# Patient Record
Sex: Female | Born: 2000 | Race: Black or African American | Hispanic: No | Marital: Single | Smoking: Never smoker
Health system: Southern US, Community
[De-identification: ages and names within clinical notes are randomized; demographics above are authoritative.]

## PROBLEM LIST (undated history)

## (undated) DIAGNOSIS — A749 Chlamydial infection, unspecified: Secondary | ICD-10-CM

## (undated) DIAGNOSIS — I10 Essential (primary) hypertension: Secondary | ICD-10-CM

## (undated) DIAGNOSIS — O24419 Gestational diabetes mellitus in pregnancy, unspecified control: Secondary | ICD-10-CM

## (undated) DIAGNOSIS — J45909 Unspecified asthma, uncomplicated: Secondary | ICD-10-CM

## (undated) DIAGNOSIS — A549 Gonococcal infection, unspecified: Secondary | ICD-10-CM

## (undated) HISTORY — DX: Gonococcal infection, unspecified: A54.9

## (undated) HISTORY — DX: Gestational diabetes mellitus in pregnancy, unspecified control: O24.419

## (undated) HISTORY — DX: Unspecified asthma, uncomplicated: J45.909

## (undated) HISTORY — DX: Chlamydial infection, unspecified: A74.9

## (undated) HISTORY — PX: NO PAST SURGERIES: SHX2092

---
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
## 2021-01-06 ENCOUNTER — Telehealth: Payer: Self-pay | Admitting: Registered"

## 2021-01-06 ENCOUNTER — Other Ambulatory Visit: Payer: Self-pay | Admitting: Family Medicine

## 2021-01-06 ENCOUNTER — Other Ambulatory Visit: Payer: Medicaid Other

## 2021-01-06 DIAGNOSIS — Z3689 Encounter for other specified antenatal screening: Secondary | ICD-10-CM

## 2021-01-06 NOTE — Telephone Encounter (Signed)
LVM for patient after she was late for Diabetes education visit. Asked her to send MyChart msg or call to reschedule

## 2021-01-11 ENCOUNTER — Encounter: Payer: Self-pay | Admitting: *Deleted

## 2021-01-11 DIAGNOSIS — O24419 Gestational diabetes mellitus in pregnancy, unspecified control: Secondary | ICD-10-CM | POA: Insufficient documentation

## 2021-01-11 DIAGNOSIS — O0990 Supervision of high risk pregnancy, unspecified, unspecified trimester: Secondary | ICD-10-CM | POA: Insufficient documentation

## 2021-01-20 ENCOUNTER — Other Ambulatory Visit: Payer: Self-pay

## 2021-01-20 ENCOUNTER — Encounter: Payer: Self-pay | Admitting: Family Medicine

## 2021-01-20 ENCOUNTER — Encounter: Payer: Self-pay | Admitting: Obstetrics and Gynecology

## 2021-01-20 ENCOUNTER — Encounter: Payer: Medicaid Other | Admitting: Obstetrics and Gynecology

## 2021-01-20 ENCOUNTER — Ambulatory Visit (INDEPENDENT_AMBULATORY_CARE_PROVIDER_SITE_OTHER): Payer: Medicaid Other | Admitting: Obstetrics and Gynecology

## 2021-01-20 VITALS — BP 133/74 | HR 78 | Wt 197.6 lb

## 2021-01-20 DIAGNOSIS — O0990 Supervision of high risk pregnancy, unspecified, unspecified trimester: Secondary | ICD-10-CM

## 2021-01-20 DIAGNOSIS — O24419 Gestational diabetes mellitus in pregnancy, unspecified control: Secondary | ICD-10-CM

## 2021-01-20 DIAGNOSIS — Z3A27 27 weeks gestation of pregnancy: Secondary | ICD-10-CM

## 2021-01-20 NOTE — Progress Notes (Signed)
° °  PRENATAL VISIT NOTE  Subjective:  Sabrina Dixon is a 20 y.o. G1P0 at [redacted]w[redacted]d being seen today for ongoing prenatal care.  She is currently monitored for the following issues for this high-risk pregnancy and has Supervision of high risk pregnancy, antepartum; Gestational diabetes mellitus (GDM) affecting pregnancy; and [redacted] weeks gestation of pregnancy on their problem list.  Pt is a transfer of care from [facility] due to gestational diabetes.  Pt has not had diabetic training as of yet.  Patient doing well with no acute concerns today. She reports  occasional pain with movement .   . Vag. Bleeding: None.  Movement: Present. Denies leaking of fluid.   The following portions of the patient's history were reviewed and updated as appropriate: allergies, current medications, past family history, past medical history, past social history, past surgical history and problem list. Problem list updated.  Objective:   Vitals:   01/20/21 1008  BP: 133/74  Pulse: 78  Weight: 197 lb 9.6 oz (89.6 kg)    Fetal Status: Fetal Heart Rate (bpm): 145 Fundal Height: 28 cm Movement: Present     General:  Alert, oriented and cooperative. Patient is in no acute distress.  Skin: Skin is warm and dry. No rash noted.   Cardiovascular: Normal heart rate noted  Respiratory: Normal respiratory effort, no problems with respiration noted  Abdomen: Soft, gravid, appropriate for gestational age.  Pain/Pressure: Absent     Pelvic: Cervical exam deferred        Extremities: Normal range of motion.  Edema: None  Mental Status:  Normal mood and affect. Normal behavior. Normal judgment and thought content.   Assessment and Plan:  Pregnancy: G1P0 at [redacted]w[redacted]d  1. Supervision of high risk pregnancy, antepartum Continue routine care  2. Gestational diabetes mellitus (GDM) affecting pregnancy Glucose monitoring sheets given, expectations given once she has had training  3. [redacted] weeks gestation of pregnancy   Preterm labor  symptoms and general obstetric precautions including but not limited to vaginal bleeding, contractions, leaking of fluid and fetal movement were reviewed in detail with the patient.  Please refer to After Visit Summary for other counseling recommendations.   Return in about 2 weeks (around 02/03/2021) for [facility], in person.   Mariel Aloe, MD Faculty Attending Center for [facility]

## 2021-01-20 NOTE — Progress Notes (Signed)
Patient (27w.4d) is here to establish prenatal care. She is a transfer from [facility] due to her having GDM. Appointment for diabetes education was made for 01/06/21 but pt was a not show. Appointment will be rescheduled.   Patient is aware of upcoming ultrasound appointment on 02/03/21 and did not have any questions or concerns for today.   Per [place], CMA

## 2021-01-27 ENCOUNTER — Encounter: Payer: Medicaid Other | Attending: Family Medicine | Admitting: Registered"

## 2021-01-27 ENCOUNTER — Other Ambulatory Visit: Payer: Self-pay | Admitting: *Deleted

## 2021-01-27 ENCOUNTER — Other Ambulatory Visit: Payer: Self-pay

## 2021-01-27 ENCOUNTER — Ambulatory Visit: Payer: Medicaid Other | Admitting: Registered"

## 2021-01-27 ENCOUNTER — Telehealth: Payer: Self-pay | Admitting: Lactation Services

## 2021-01-27 VITALS — Wt 172.3 lb

## 2021-01-27 DIAGNOSIS — O24419 Gestational diabetes mellitus in pregnancy, unspecified control: Secondary | ICD-10-CM

## 2021-01-27 DIAGNOSIS — Z3A00 Weeks of gestation of pregnancy not specified: Secondary | ICD-10-CM | POA: Diagnosis not present

## 2021-01-27 MED ORDER — ACCU-CHEK SOFTCLIX LANCETS MISC: 12 refills | Status: DC

## 2021-01-27 MED ORDER — ACCU-CHEK GUIDE VI STRP: ORAL_STRIP | 12 refills | Status: DC

## 2021-01-27 NOTE — Progress Notes (Signed)
Patient was seen for Gestational Diabetes self-management on 01/27/21  Start time 1018 and End time 1112   Estimated due date: 04/17/21; [redacted]w[redacted]d  Wt Readings from Last 3 Encounters:  01/27/21 172 lb 4.8 oz (78.2 kg) (92 %, Z= 1.44)*  01/20/21 197 lb 9.6 oz (89.6 kg) (97 %, Z= 1.91)*   * Growth percentiles are based on CDC (Girls, 2-20 Years) data.   The weight entered on 01/20/21 is likely incorrect. The discrepancy was not noticed until after the patient left so weight was not re-taken. Just from visual guess of weight, 172 is more likely to be correct.  Clinical: Medications: reviewed Medical History: reviewed  Labs: OGTT 1 hr 815, A1c 5.6%   Dietary and Lifestyle History: Pt states her mother had GDM with her brother 10 yr ago.   Pt reports N/V first 7 weeks of pregnancy, followed by 2 weeks of intermittent hypoglycemic sxs. Pt denies hypoglycemic sxs since then.   Pt state she likes to eat Ramen noodles and family has told her not to eat it. Pt reports she likes a variety of vegetables and does not drink soda.  Physical Activity: 7x/week 30 min (walking dogs several times per day) Stress: not assessed Sleep: not assesed  24 hr Recall: not assessed First Meal: Snack: Second meal: Snack: Third meal: Snack: Beverages: water, coffee ~1x/week w/ 1 Tbs sugar  NUTRITION INTERVENTION  Nutrition education (E-1) on the following topics:   Initial Follow-up  [x]  []  Definition of Gestational Diabetes [x]  []  Why dietary management is important in controlling blood glucose [x]  []  Effects each nutrient has on blood glucose levels [x]  []  Simple carbohydrates vs complex carbohydrates []  []  Fluid intake [x]  []  Creating a balanced meal plan [x]  []  Carbohydrate counting  [x]  []  When to check blood glucose levels [x]  []  Proper blood glucose monitoring techniques [x]  []  Effect of stress and stress reduction techniques  [x]  []  Exercise effect on blood glucose levels, appropriate  exercise during pregnancy [x]  []  Importance of limiting caffeine and abstaining from alcohol and smoking [x]  []  Medications used for blood sugar control during pregnancy [x]  []  Hypoglycemia and rule of 15 [x]  []  Postpartum self care  Blood glucose monitor given: Accu-chek Guide Me Lot Exp: 03/11/2022 CBG: 113 mg/dL  Patient instructed to monitor glucose levels: FBS: 60 - ? 95 mg/dL (some clinics use 90 for cutoff) 1 hour: ? 140 mg/dL 2 hour: ? mg/dL  Patient received handouts: Nutrition Diabetes and Pregnancy Carbohydrate Counting List  Patient will be seen for follow-up as needed.

## 2021-01-27 NOTE — Telephone Encounter (Signed)
Patient with questions about leaking breast milk since 17 weeks while in the office seeing Diabetes Educator.   Reviewed leaking of breast milk normal during pregnancy. Patient asked if she should pump, advised no pumping during pregnancy. Reviewed some women do hand express after 37 weeks of pregnancy but no pumping recommended.   Patient voiced understanding with no further questions or concerns.

## 2021-01-30 DIAGNOSIS — Z8632 Personal history of gestational diabetes: Secondary | ICD-10-CM

## 2021-01-30 HISTORY — DX: Personal history of gestational diabetes: Z86.32

## 2021-02-03 ENCOUNTER — Ambulatory Visit: Payer: Medicaid Other | Admitting: *Deleted

## 2021-02-03 ENCOUNTER — Ambulatory Visit: Payer: Medicaid Other | Attending: Family Medicine

## 2021-02-03 ENCOUNTER — Other Ambulatory Visit: Payer: Self-pay

## 2021-02-03 ENCOUNTER — Encounter: Payer: Self-pay | Admitting: *Deleted

## 2021-02-03 ENCOUNTER — Ambulatory Visit (HOSPITAL_BASED_OUTPATIENT_CLINIC_OR_DEPARTMENT_OTHER): Payer: Medicaid Other | Admitting: Obstetrics and Gynecology

## 2021-02-03 VITALS — BP 139/71 | HR 91

## 2021-02-03 DIAGNOSIS — O0990 Supervision of high risk pregnancy, unspecified, unspecified trimester: Secondary | ICD-10-CM | POA: Diagnosis present

## 2021-02-03 DIAGNOSIS — O24410 Gestational diabetes mellitus in pregnancy, diet controlled: Secondary | ICD-10-CM | POA: Diagnosis not present

## 2021-02-03 DIAGNOSIS — Z3689 Encounter for other specified antenatal screening: Secondary | ICD-10-CM

## 2021-02-03 DIAGNOSIS — O24419 Gestational diabetes mellitus in pregnancy, unspecified control: Secondary | ICD-10-CM | POA: Insufficient documentation

## 2021-02-03 DIAGNOSIS — Z3A29 29 weeks gestation of pregnancy: Secondary | ICD-10-CM | POA: Diagnosis not present

## 2021-02-03 NOTE — Progress Notes (Signed)
Maternal-Fetal Medicine   Name: Ruqayyah Corbit DOB: 07/08/2000 MRN: [id number] Referring Provider: Lynnda Shields, MD  I had the pleasure of seeing Ms. Jungman today at the [facility]. She is G1 P0 at 29w 4d gestation and is here for ultrasound evaluation of her pregnancy.  She has a diagnosis of gestational diabetes and has transferred her prenatal care [facility] the [facility].  Patient had consultation with the diabetic educator and is started checking her blood glucose regularly.  She reports both fasting and postprandial levels are within normal range. She informed that she had prenatal screening for fetal aneuploidies and the risk for not increased.  Ultrasound Fetal growth is appropriate for gestational age.  Amniotic fluid is normal good fetal activity seen.  Fetal anatomical survey appears normal but limited by advanced gestational age.  Gestational diabetes I explained the diagnosis of gestational diabetes.  I emphasized the importance of good blood glucose control to prevent adverse fetal or neonatal outcomes.  I discussed blood glucose normal values. I encouraged her to check her blood glucose regularly. Possible complications of gestational diabetes include fetal macrosomia, shoulder dystocia and birth injuries, stillbirth (in poorly controlled diabetes) and neonatal respiratory syndrome and other complications.  In about 85% of cases, gestational diabetes is well controlled by diet alone.  Exercise reduces the need for insulin.  Medical treatment includes oral hypoglycemics or insulin. If patient requires insulin or oral hypoglycemics, we recommend weekly antenatal testing from 32 weeks' gestation till delivery.  Timing of delivery: In well-controlled diabetes on diet, patient can be delivered at 14- or 40-weeks' gestation. Vaginal delivery is not contraindicated. Type 2 diabetes develops in about 25% to 40% of women with GDM. I recommend postpartum  screening with 75-g glucose load at 6 to 12 weeks after delivery.  Recommendations -An appointment was made for her to return in 4 weeks for fetal growth assessment. -If patient requires oral hypoglycemics or insulin, we recommend weekly BPP from [redacted] weeks gestation till delivery.  Thank you for consultation.  If you have any questions or concerns, please contact me the [facility].  Consultation including face-to-face (more than 50%) counseling 30 minutes.

## 2021-02-04 ENCOUNTER — Other Ambulatory Visit: Payer: Self-pay | Admitting: *Deleted

## 2021-02-04 DIAGNOSIS — O26849 Uterine size-date discrepancy, unspecified trimester: Secondary | ICD-10-CM

## 2021-02-04 DIAGNOSIS — O24410 Gestational diabetes mellitus in pregnancy, diet controlled: Secondary | ICD-10-CM

## 2021-02-04 DIAGNOSIS — Z3689 Encounter for other specified antenatal screening: Secondary | ICD-10-CM

## 2021-02-08 ENCOUNTER — Ambulatory Visit (INDEPENDENT_AMBULATORY_CARE_PROVIDER_SITE_OTHER): Payer: Medicaid Other | Admitting: Family Medicine

## 2021-02-08 ENCOUNTER — Other Ambulatory Visit: Payer: Self-pay

## 2021-02-08 ENCOUNTER — Other Ambulatory Visit (HOSPITAL_COMMUNITY)
Admission: RE | Admit: 2021-02-08 | Discharge: 2021-02-08 | Disposition: A | Discharge status: Home / Self Care | Payer: Medicaid Other | Source: Ambulatory Visit | Attending: Family Medicine | Admitting: Family Medicine

## 2021-02-08 VITALS — BP 130/72 | HR 102 | Wt 175.9 lb

## 2021-02-08 DIAGNOSIS — O24419 Gestational diabetes mellitus in pregnancy, unspecified control: Secondary | ICD-10-CM

## 2021-02-08 DIAGNOSIS — O0990 Supervision of high risk pregnancy, unspecified, unspecified trimester: Secondary | ICD-10-CM

## 2021-02-08 DIAGNOSIS — A749 Chlamydial infection, unspecified: Secondary | ICD-10-CM | POA: Insufficient documentation

## 2021-02-08 NOTE — Patient Instructions (Addendum)
Contraception Choices °Contraception, also called birth control, refers to methods or devices that prevent pregnancy. °Hormonal methods °Contraceptive implant °A contraceptive implant is a thin, plastic tube that contains a hormone that prevents pregnancy. It is different from an intrauterine device (IUD). It is inserted into the upper part of the arm by a health care provider. Implants can be effective for up to 3 years. °Progestin-only injections °Progestin-only injections are injections of progestin, a synthetic form of the hormone progesterone. They are given every 3 months by a health care provider. °Birth control pills °Birth control pills are pills that contain hormones that prevent pregnancy. They must be taken once a day, preferably at the same time each day. A prescription is needed to use this method of contraception. °Birth control patch °The birth control patch contains hormones that prevent pregnancy. It is placed on the skin and must be changed once a week for three weeks and removed on the fourth week. A prescription is needed to use this method of contraception. °Vaginal ring °A vaginal ring contains hormones that prevent pregnancy. It is placed in the vagina for three weeks and removed on the fourth week. After that, the process is repeated with a new ring. A prescription is needed to use this method of contraception. °Emergency contraceptive °Emergency contraceptives prevent pregnancy after unprotected sex. They come in pill form and can be taken up to 5 days after sex. They work best the sooner they are taken after having sex. Most emergency contraceptives are available without a prescription. This method should not be used as your only form of birth control. °Barrier methods °Female condom °A female condom is a thin sheath that is worn over the penis during sex. Condoms keep sperm from going inside a woman's body. They can be used with a sperm-killing substance (spermicide) to increase their  effectiveness. They should be thrown away after one use. °Female condom °A female condom is a soft, loose-fitting sheath that is put into the vagina before sex. The condom keeps sperm from going inside a woman's body. They should be thrown away after one use. °Diaphragm °A diaphragm is a soft, dome-shaped barrier. It is inserted into the vagina before sex, along with a spermicide. The diaphragm blocks sperm from entering the uterus, and the spermicide kills sperm. A diaphragm should be left in the vagina for 6-8 hours after sex and removed within 24 hours. °A diaphragm is prescribed and fitted by a health care provider. A diaphragm should be replaced every 1-2 years, after giving birth, after gaining more than 15 lb (6.8 kg), and after pelvic surgery. °Cervical cap °A cervical cap is a round, soft latex or plastic cup that fits over the cervix. It is inserted into the vagina before sex, along with spermicide. It blocks sperm from entering the uterus. The cap should be left in place for 6-8 hours after sex and removed within 48 hours. A cervical cap must be prescribed and fitted by a health care provider. It should be replaced every 2 years. °Sponge °A sponge is a soft, circular piece of polyurethane foam with spermicide in it. The sponge helps block sperm from entering the uterus, and the spermicide kills sperm. To use it, you make it wet and then insert it into the vagina. It should be inserted before sex, left in for at least 6 hours after sex, and removed and thrown away within 30 hours. °Spermicides °Spermicides are chemicals that kill or block sperm from entering the cervix and uterus.   They can come as a cream, jelly, suppository, foam, or tablet. A spermicide should be inserted into the vagina with an applicator at least 10-15 minutes before sex to allow time for it to work. The process must be repeated every time you have sex. Spermicides do not require a prescription. Intrauterine  contraception Intrauterine device (IUD) An IUD is a T-shaped device that is put in a woman's uterus. There are two types: Hormone IUD.This type contains progestin, a synthetic form of the hormone progesterone. This type can stay in place for 3-5 years. Copper IUD.This type is wrapped in copper wire. It can stay in place for 10 years. Permanent methods of contraception Female tubal ligation In this method, a woman's fallopian tubes are sealed, tied, or blocked during surgery to prevent eggs from traveling to the uterus. Hysteroscopic sterilization In this method, a small, flexible insert is placed into each fallopian tube. The inserts cause scar tissue to form in the fallopian tubes and block them, so sperm cannot reach an egg. The procedure takes about 3 months to be effective. Another form of birth control must be used during those 3 months. Female sterilization This is a procedure to tie off the tubes that carry sperm (vasectomy). After the procedure, the man can still ejaculate fluid (semen). Another form of birth control must be used for 3 months after the procedure. Natural planning methods Natural family planning In this method, a couple does not have sex on days when the woman could become pregnant. Calendar method In this method, the woman keeps track of the length of each menstrual cycle, identifies the days when pregnancy can happen, and does not have sex on those days. Ovulation method In this method, a couple avoids sex during ovulation. Symptothermal method This method involves not having sex during ovulation. The woman typically checks for ovulation by watching changes in her temperature and in the consistency of cervical mucus. Post-ovulation method In this method, a couple waits to have sex until after ovulation. Where to find more information Centers for Disease Control and Prevention: FootballExhibition.com.br Summary Contraception, also called birth control, refers to methods or devices  that prevent pregnancy. Hormonal methods of contraception include implants, injections, pills, patches, vaginal rings, and emergency contraceptives. Barrier methods of contraception can include female condoms, female condoms, diaphragms, cervical caps, sponges, and spermicides. There are two types of IUDs (intrauterine devices). An IUD can be put in a woman's uterus to prevent pregnancy for 3-5 years. Permanent sterilization can be done through a procedure for males and females. Natural family planning methods involve nothaving sex on days when the woman could become pregnant. This information is not intended to replace advice given to you by your health care provider. Make sure you discuss any questions you have with your health care provider. Document Revised: 06/23/2019 Document Reviewed: 06/23/2019 Elsevier Patient Education  2022 Elsevier Inc.   AREA PEDIATRIC/FAMILY PRACTICE PHYSICIANS  Central/Southeast [place] ([id number]) [facility] Deirdre Priest, MD; Lum Babe, MD; Sheffield Slider, MD; Leveda Anna, MD; McDiarmid, MD; Jerene Bears, MD; Jennette Kettle, MD; Gwendolyn Grant, MD [address], [place], [place] [address] [phone] Mon-Fri 8:30-12:30, 1:30-5:00 Providers come to see babies at [facility] Accepting [facility] providers who accept newborns: Docia Chuck, MD; Kateri Plummer, MD; Paulino Rily, MD [address], [place], [place] [address] [phone] Mon-Fri 8:00-5:30 Babies seen by providers at [facility] Does NOT accept Medicaid Please call early in hospitalization for appointment (limited availability)  [facility], MD [phone]  [address] [place], [place] [address] [phone] Farris HasMon, Tue, Thur, Fri 8:30-5:00, Wed 10:00-7:00 (closed 1-2pm) Babies seen by [facility] providers Accepting Medicaid Donnie Coffinubin - Pediatrician Donnie Coffinubin, MD [address], [place], [place] [address] [phone] Mon-Fri  8:30-5:00, Sat 8:30-12:00 Provider comes to see babies at [facility] Accepting Medicaid Must have been referred from current patients or contacted office prior to delivery [facility] Manson PasseyBrown, MD; Ave Filterhandler, MD; Luna FuseEttefagh, MD; Kennedy BuckerGrant, MD; Konrad DoloresLester, MD; Kathlene NovemberMcCormick, MD; Jenne CampusMcQueen, MD; Lubertha SouthProse, MD; Wynetta EmerySimha, MD; Duffy RhodyStanley, MD; Gerre CouchStryffeler, NP; Shirl Harrisebben, NP [address], [place], [place] [address] [phone] Mon, Halford Decampue, Thur, Fri 8:30-5:30, Wed 9:30-5:30, Sat 8:30-12:30 Babies seen by [facility] providers Accepting Medicaid Only accepting infants of first-time parents or siblings of current patients Hospital discharge coordinator will make follow-up appointment Cyril MourningJack Amos [address], [place], [place]  [address] [phone]   Fax - [phone] [facility] [address], [place], [place]  [address] Phone - [phone]   Fax - [phone] Lucio EdwardShilpa Gosrani [address], [address], [place], [place]  [address] [phone]  [place] [phone]) [facility] Jenne PaneBates, MD; Alita ChyleBrassfield, MD; Princella Ionooper, Cox, MD; MD; Earlene Plateravis, MD; Jamesetta Orleansovico, MD; Alvera NovelEttefaugh, MD; Clarene DukeLittle, MD; Rana SnareLowe, MD; Carmon GinsbergKeiffer, MD; Alinda MoneyMelvin, MD; Hosie PoissonSumner, MD; Mayford KnifeWilliams, MD [address], [place], [place] [address] [phone] Mon-Fri 8:30-5:00 (extended evenings Mon-Thur as needed), Sat-Sun 10:00-1:00 Providers come to see babies at [facility] Accepting Medicaid for families of first-time babies and families with all children in the household age 263 and under. Must register with office prior to making appointment (M-F only). [facility] Suezanne JacquetHenson, NP; Lynelle DoctorKnapp, MD; Susann GivensLalonde, MD; [place], [place] [address] [place], [place] [address] [phone] Mon-Fri 8:00-5:00 Babies seen by providers at [facility] Does NOT accept Medicaid/Commercial Insurance Only [facility] at [place]  ([facility])  Holly BodilyArtis, MD; Zachery DauerBarnes, MD; Stefan ChurchBratton, MD; Sabino Dickoccaro, MD; Quitman LivingsLockett Gardner, MD; Farris HasKramer, MD; Gaynell FaceMarshall, MD; Betha LoaNetherton, MD; Colon FlatteryPoleto, MD; [place] JamesSkinner, MD [address] [place], [place] [address] [phone] Mon-Fri 8:30-5:30, Sat (Oct.-Mar.) 9:00-1:00 Babies seen by providers at [facility] Accepting [facility]  [place] [phone]) [facility] [place] Reid, MD; Sheliah HatchWarner, MD [address], [place], [place] [phone] Mon-Fri 8:30-5:00, Sat 8:30-12:00 Providers come to see babies at [facility] Does NOT accept [facility] at Lutricia Feilriad Becker, [place]; Tracie HarrierHagler, MD; VerandahScifres, [place]; Wynelle LinkSun, MD; Azucena CecilSwayne, MD [address], [place], [place] [address] [phone] Mon-Fri 8:00-5:00 Babies seen by providers at [facility] Does NOT accept Medicaid Only accepting babies of parents who are patients Please call early in hospitalization for appointment (limited availability) [facility] Chestine Sporelark, MD; Abran CantorFrye, MD; Early OsmondKelleher, MD; Cherre HugerMack, NP; Hyacinth MeekerMiller, MD; Dwan Bolt'Keller, MD; Jarold MottoPatterson, NP; Dario GuardianPudlo, MD; Talmage NapPuzio, MD; Maisie Fushomas, MD; Pricilla Holmucker, MD; Tama Highwiselton, MD [address], [place], [place] [address] [phone] Mon-Fri 8:00-5:00, Sat 9:00-12:00 Providers come to see babies at [facility] Does NOT accept [facility]  [place] [employer] [phone]) [facility] at [facility] providers accepting new patients: Drema PryBrake, NP; Delena ServeWharton, PA [address], [place], [place] [address] [phone] Mon-Fri 8:00-5:00 Babies seen by providers at [facility] Does NOT accept Medicaid Only accepting babies of parents who are patients Please call early in hospitalization for appointment (limited availability) [facility] Cardell PeachGay, MD; Nash DimmerQuinlan, MD [address] [place], [place] [address] [phone] (press 1 to schedule appointment) Mon-Fri 8:00-5:00 Providers come to  see babies at [facility] Does  NOT accept [facility], MD [address] [place], [place] [address] [phone] Mon-Fri 8:30-5:00 (lunch 12:30-1:00), extended hours by appointment only Wed 5:00-6:30 Babies seen by [facility] providers Accepting Medicaid [facility] Carmine, MD; [place], MD; Hassan Rowan, MD [address] [place], [place], [place] [address] [phone] Mon-Fri 8:00-5:00 Babies seen by [facility] providers Does NOT accept Medicaid [facility] Boykin Peek, MD; Durene Cal, MD; [place], [place] [address] [place], [place] [address] [phone] Mon-Fri 8:00-5:00 Babies seen by [facility] providers Does NOT accept [facility] Carrizo, [place]; [place], [place]; Roy, NP; Avis Epley, MD; Vonna Kotyk, MD; Clance Boll, MD; Stevphen Rochester, NP; Arvilla Market, NP; Ann Maki, NP; Otis Dials, NP; Vaughan Basta, MD; Eartha Inch, MD [address] [place], [place] [address] [phone] Mon-Fri 8:30-5:00, Sat 10:00-1:00 Providers come to see babies at [facility] Does NOT accept Medicaid Free prenatal information session Tuesdays at 4:45pm [facility] Sugarmill Woods, MD; [place], [place]; [place], [place]; [place], [place] [address] [place] [address] [phone] Mon-Fri 7:30-5:30 Babies seen by [facility] providers [facility] Doctor [address], [place], [place]  [address] [phone]   Fax - [phone]  [place] [phone] & [phone]) [facility], MD [address], [place], [place] [address] [phone] Mon-Thur 8:00-6:00 Providers come to see babies at [facility] Accepting Medicaid [facility] Dareen Piano, NP; Cyndia Bent, MD; Todd [place], [place]; [place], [place] [address] [place], [place] [address] [phone] Mon-Thur 7:30-7:30, Fri 7:30-4:30 Babies seen by [facility] providers Accepting [facility] Juanito Doom, MD; Janene Harvey, NP;  Vonita Moss, MD [address], [place], [place] [address] [phone] Mon-Fri 8:30-5:00, Sat 8:30-12:00 Providers come to see babies at [facility] Accepting Medicaid Must have Meet & Greet appointment at office prior to delivery [facility] Marlow Baars, MD; Earlene Plater, MD; Lucretia Roers, MD [address], [place], [place] [address] [phone] Mon-Wed 8:00-6:00, Thur-Fri 8:00-5:00, Sat 9:00-12:00 Providers come to see babies at [facility] Does NOT accept Medicaid Only accepting siblings of current patients [facility]  [address], [place], [place]  [address] [phone]   Fax - [phone] [facility] at [facility] [address] [place], [place]  [address] [phone]   Fax - [phone]  [place] [phone] & [phone]) Adult nurse HealthCare at [facility], [place]; [place], [place] [address] [place], [place] [address] [phone] Mon-Fri 7:00-5:00 Babies seen by [facility] providers Does NOT accept Medicaid [facility] [place], MD; [place], [place]; [place], [place] [address], [place], [place] [address] [phone] Mon-Fri 8:00-5:00 Babies seen by [facility] providers Accepting [facility] - Dorann Lodge [place], MD; [place], [place]; Compton, NP; [place], [place] [address] [place], [place], [place] [address] [phone] Mon-Fri 8:00-5:00 Babies seen by providers at [facility] Accepting [facility] [place] [phone]) [facility] Primary Care at [facility] [place], [place] [address] [place], [place] [address] [phone] Mon-Fri 8:00-5:00 Babies seen by [facility] providers Does NOT accept Medicaid Limited availability, please call early in hospitalization to schedule follow-up [facility] Jeanelle Malling, [place]; Eddie Candle, MD; Normand Sloop, MD; [place], [place]; Constance Goltz, MD; [place], [place] [address] [facility] [address], [place], [place] [address] [phone] Mon-Fri 8:30-5:00, Sat 9:00-12:00 Babies seen by providers at [facility] Accepting Medicaid Please register online then schedule online or call office  [email or web address] [facility] - [facility] at [facility]) Durene Cal, NP; Lucianne Muss, MD; [place] Clam, [place] [address], [place], [place] [address] [phone] Mon-Fri 8:00-5:00 Babies seen by providers at [facility] Accepting [facility] - [facility] ([facility]) [place], MD; Reed Breech, NP; Shelva Majestic, MD [address], [place], [place] [address] [phone] Mon-Fri 8:00-5:30, Sat&Sun by appointment (phones open at 8:30) Babies seen by [facility] providers Accepting Medicaid Must be a first-time baby or sibling of current patient [facility] [place]  [address], [place], [place]  [address] [phone]   Fax - [phone]  High [address] [phone] & [phone]) [facility] [place], [place]; [place], [place]; [place], MD; [place], [place]; Carolyne Fiscal, MD [address] [place], [place] [address] [phone] Mon-Thur 8:00-7:00, Fri 8:00-5:00, Sat 8:00-12:00, Sun 9:00-12:00 Babies seen by [facility] providers Accepting Medicaid [facility] - Family Medicine at Liana Gerold, MD; Gaynell Face, MD; [facility], MD [address], [place], [place] [address] [phone] Mon-Thur 8:00-5:00 Babies seen by providers at [facility] Accepting Medicaid [facility] - Family Medicine at Dorthey Sawyer, MD; Coe-Goins, MD; Madilyn Fireman, MD; Melvyn Neth, MD; List, MD; Lazarus Salines, MD; Gaynell Face, MD; Berneda Rose, MD; Flora Lipps, MD; Beryl Meager, MD; Luther Redo, MD; Lavonia Drafts, MD; Kellie Simmering, MD [address] [place], [place]  [address] [phone] Mon-Fri 8:00-5:30, Sat (Oct.-Mar.) 9:00-1:00 Babies seen by providers at [facility] Accepting Medicaid Must fill out new patient packet, available online at [email or web address] - [facility] [facility]) Spero Geralds, NP; Tiburcio Pea, NP; Tresa Endo, NP; Whitney Post, MD; [place], [place]; Hennie Duos, MD; [place], MD; Kavin Leech, NP [address], [place], [place] [address] [phone] Mon-Thur 8:00-5:30, Fri 8:00-5:00 Babies seen by providers at [facility] Accepting [facility]  [place] [phone]) [facility] [place], [place]; [place], MD; [place], MD; [place], [place] [address] [place], [place] [address] [phone] Mon-Fri 8:00-5:00 Babies seen by providers at [facility] Accepting [facility]   [place] [phone]) [facility] at [facility], [place]; Lenise Arena, MD; [place], [place] [address], [place], [place] [address] [phone] Mon-Fri 8:00-5:00 Babies seen by providers at [facility] Does NOT accept Medicaid Limited appointment availability, please call early in hospitalization  [facility], [place]; [place], MD [address] [place], [place] [address] [phone] Mon-Fri 8:00-5:00 Babies seen by [facility] providers Does NOT accept [facility] - [facility], MD; Ninetta Lights, MD; [place], [place]; [place], MD [address], [place], [place] [address] [phone] Mon-Fri 8:00-5:00 After hours clinic [facility] [place], [place] [address]) [phone] Mon-Fri 5:00-8:00, Sat 12:00-6:00, Sun 10:00-4:00 Babies seen by [facility] providers Accepting [facility] Family Medicine at [facility] [address] [place], [place]  [address] [phone]   Fax - [phone]  [place] [phone]) Adult nurse HealthCare at [facility], MD [address] [place], [place], [place]  [address] [phone] Mon-Fri 8:00-5:00 Babies seen by [facility] providers Does NOT accept Medicaid [facility] - [place] [facility] [place]) Rene Kocher, MD [address] [place], [place] [address] [phone] Mon-Thur 8:00-7:00, Fri 8:00-5:00, Sat 8:00-12:00 Babies seen by providers at [facility] Accepting Medicaid - but does not have vaccinations in office (must be received elsewhere) Limited availability, please call early in hospitalization  [place] ([id number]) [facility]  Mayo Ao, MD [address] [place] [address] [phone]  Fax [phone]

## 2021-02-08 NOTE — Progress Notes (Signed)
° °  Subjective:  Sabrina Dixon is a 21 y.o. G1P0 at [redacted]w[redacted]d being seen today for ongoing prenatal care.  She is currently monitored for the following issues for this high-risk pregnancy and has Supervision of high risk pregnancy, antepartum; Gestational diabetes mellitus (GDM) affecting pregnancy; and [redacted] weeks gestation of pregnancy on their problem list.  Patient reports no complaints.  Contractions: Not present. Vag. Bleeding: None.  Movement: Present. Denies leaking of fluid.   The following portions of the patient's history were reviewed and updated as appropriate: allergies, current medications, past family history, past medical history, past social history, past surgical history and problem list. Problem list updated.  Objective:   Vitals:   02/08/21 1141  BP: 130/72  Pulse: (!) 102  Weight: 175 lb 14.4 oz (79.8 kg)    Fetal Status: Fetal Heart Rate (bpm): 154   Movement: Present     General:  Alert, oriented and cooperative. Patient is in no acute distress.  Skin: Skin is warm and dry. No rash noted.   Cardiovascular: Normal heart rate noted  Respiratory: Normal respiratory effort, no problems with respiration noted  Abdomen: Soft, gravid, appropriate for gestational age. Pain/Pressure: Absent     Pelvic: Vag. Bleeding: None     Cervical exam deferred        Extremities: Normal range of motion.  Edema: None  Mental Status: Normal mood and affect. Normal behavior. Normal judgment and thought content.   Urinalysis:        Assessment and Plan:  Pregnancy: G1P0 at [redacted]w[redacted]d  1. Supervision of high risk pregnancy, antepartum BP and FHR normal Discussed contraception at length, referred to bedsider Advised TDaP, she will consider  2. Chlamydia Reports took treatment as did partner TOC today, self swab - Cervicovaginal ancillary only( [place])  3. Gestational diabetes mellitus (GDM) affecting pregnancy Log reviewed, see picture, well controlled Cont dietary  measures Growth [place] 02/03/21, 77%, AFI 13  Preterm labor symptoms and general obstetric precautions including but not limited to vaginal bleeding, contractions, leaking of fluid and fetal movement were reviewed in detail with the patient. Please refer to After Visit Summary for other counseling recommendations.  Return in 2 weeks (on 02/22/2021) for [facility], ob visit.   Clarnce Flock, MD

## 2021-02-09 LAB — CERVICOVAGINAL ANCILLARY ONLY
Bacterial Vaginitis (gardnerella): NEGATIVE
Candida Glabrata: POSITIVE — AB
Candida Vaginitis: POSITIVE — AB
Chlamydia: POSITIVE — AB
Comment: NEGATIVE
Comment: NEGATIVE
Comment: NEGATIVE
Comment: NEGATIVE
Comment: NEGATIVE
Comment: NORMAL
Neisseria Gonorrhea: NEGATIVE
Trichomonas: NEGATIVE

## 2021-02-10 ENCOUNTER — Telehealth: Payer: Self-pay

## 2021-02-10 NOTE — Telephone Encounter (Addendum)
-----   Message from Venora Maples, MD sent at 02/10/2021  8:42 AM EST ----- +chlamydia, not within the window I'd expect for a false positive as her treatment was over a month ago. Please call patient and let her know result and that she needs to come in for treatment, and her partner also needs to be retreated Also please send an rx for diflucan 150mg  x1 for yeast infection  Called pt and left message that I am calling with results if she could please give a call back.    [place]  02/10/21

## 2021-02-11 MED ORDER — AZITHROMYCIN 250 MG PO TABS: 1000.0000 mg | ORAL_TABLET | Freq: Once | ORAL | 0 refills | Status: AC

## 2021-02-11 MED ORDER — FLUCONAZOLE 150 MG PO TABS: 150.0000 mg | ORAL_TABLET | Freq: Once | ORAL | 0 refills | Status: AC

## 2021-02-11 NOTE — Addendum Note (Signed)
Addended by: Langston Reusing on: 02/11/2021 10:38 AM   Modules accepted: Orders

## 2021-02-11 NOTE — Telephone Encounter (Addendum)
Called pt and discussed test results showing +Chlamydia and +vaginal yeast. Prescriptions will be sent to her pharmacy today. Pt was advised that her partner will also require treatment for Chlamydia and that they will need to wait to have sex until 2 weeks after they both have been treated.  Pt voiced understanding.

## 2021-02-14 ENCOUNTER — Encounter: Payer: Self-pay | Admitting: *Deleted

## 2021-02-23 ENCOUNTER — Encounter: Payer: Self-pay | Admitting: Family Medicine

## 2021-02-23 ENCOUNTER — Ambulatory Visit (INDEPENDENT_AMBULATORY_CARE_PROVIDER_SITE_OTHER): Payer: Medicaid Other | Admitting: Family Medicine

## 2021-02-23 ENCOUNTER — Other Ambulatory Visit: Payer: Self-pay

## 2021-02-23 VITALS — BP 116/70 | HR 95 | Wt 182.2 lb

## 2021-02-23 DIAGNOSIS — O24419 Gestational diabetes mellitus in pregnancy, unspecified control: Secondary | ICD-10-CM

## 2021-02-23 DIAGNOSIS — O0990 Supervision of high risk pregnancy, unspecified, unspecified trimester: Secondary | ICD-10-CM

## 2021-02-23 NOTE — Patient Instructions (Signed)

## 2021-02-23 NOTE — Progress Notes (Signed)
° °  Subjective:  Angelo Durden is a 21 y.o. G1P0 at [redacted]w[redacted]d being seen today for ongoing prenatal care.  She is currently monitored for the following issues for this high-risk pregnancy and has Supervision of high risk pregnancy, antepartum; Gestational diabetes mellitus (GDM) affecting pregnancy; and [redacted] weeks gestation of pregnancy on their problem list.  Patient reports backache.  Contractions: Irritability. Vag. Bleeding: None.  Movement: Present. Denies leaking of fluid.   The following portions of the patient's history were reviewed and updated as appropriate: allergies, current medications, past family history, past medical history, past social history, past surgical history and problem list. Problem list updated.  Objective:   Vitals:   02/23/21 1118  BP: 116/70  Pulse: 95  Weight: 182 lb 3.2 oz (82.6 kg)    Fetal Status: Fetal Heart Rate (bpm): 175   Movement: Present     General:  Alert, oriented and cooperative. Patient is in no acute distress.  Skin: Skin is warm and dry. No rash noted.   Cardiovascular: Normal heart rate noted  Respiratory: Normal respiratory effort, no problems with respiration noted  Abdomen: Soft, gravid, appropriate for gestational age. Pain/Pressure: Present     Pelvic: Vag. Bleeding: None     Cervical exam deferred        Extremities: Normal range of motion.  Edema: None  Mental Status: Normal mood and affect. Normal behavior. Normal judgment and thought content.   Urinalysis:      Assessment and Plan:  Pregnancy: G1P0 at [redacted]w[redacted]d  1. Supervision of high risk pregnancy, antepartum BP normal FHR 175 on initial check, 158 on recheck  2. Gestational diabetes mellitus (GDM) affecting pregnancy Only 3 values out of range Well controlled on diet Following w MFM  Preterm labor symptoms and general obstetric precautions including but not limited to vaginal bleeding, contractions, leaking of fluid and fetal movement were reviewed in detail with the  patient. Please refer to After Visit Summary for other counseling recommendations.  Return in 2 weeks (on 03/09/2021) for [facility], ob visit, needs MD.   Clarnce Flock, MD

## 2021-03-04 ENCOUNTER — Ambulatory Visit: Payer: Medicaid Other | Admitting: *Deleted

## 2021-03-04 ENCOUNTER — Encounter: Payer: Self-pay | Admitting: *Deleted

## 2021-03-04 ENCOUNTER — Other Ambulatory Visit: Payer: Self-pay | Admitting: *Deleted

## 2021-03-04 ENCOUNTER — Other Ambulatory Visit: Payer: Self-pay

## 2021-03-04 ENCOUNTER — Ambulatory Visit: Payer: Medicaid Other | Attending: Obstetrics and Gynecology

## 2021-03-04 VITALS — BP 130/82 | HR 66

## 2021-03-04 DIAGNOSIS — O24419 Gestational diabetes mellitus in pregnancy, unspecified control: Secondary | ICD-10-CM | POA: Insufficient documentation

## 2021-03-04 DIAGNOSIS — O24410 Gestational diabetes mellitus in pregnancy, diet controlled: Secondary | ICD-10-CM | POA: Diagnosis present

## 2021-03-04 DIAGNOSIS — Z3689 Encounter for other specified antenatal screening: Secondary | ICD-10-CM | POA: Insufficient documentation

## 2021-03-04 DIAGNOSIS — Z362 Encounter for other antenatal screening follow-up: Secondary | ICD-10-CM | POA: Diagnosis not present

## 2021-03-04 DIAGNOSIS — O26849 Uterine size-date discrepancy, unspecified trimester: Secondary | ICD-10-CM | POA: Insufficient documentation

## 2021-03-04 DIAGNOSIS — O0990 Supervision of high risk pregnancy, unspecified, unspecified trimester: Secondary | ICD-10-CM | POA: Diagnosis present

## 2021-03-04 DIAGNOSIS — O3663X0 Maternal care for excessive fetal growth, third trimester, not applicable or unspecified: Secondary | ICD-10-CM

## 2021-03-04 DIAGNOSIS — Z3A33 33 weeks gestation of pregnancy: Secondary | ICD-10-CM | POA: Diagnosis not present

## 2021-03-09 ENCOUNTER — Encounter: Payer: Self-pay | Admitting: Family Medicine

## 2021-03-09 ENCOUNTER — Other Ambulatory Visit (HOSPITAL_COMMUNITY)
Admission: RE | Admit: 2021-03-09 | Discharge: 2021-03-09 | Disposition: A | Discharge status: Home / Self Care | Payer: Medicaid Other | Source: Ambulatory Visit | Attending: Family Medicine | Admitting: Family Medicine

## 2021-03-09 ENCOUNTER — Other Ambulatory Visit: Payer: Self-pay

## 2021-03-09 ENCOUNTER — Ambulatory Visit (INDEPENDENT_AMBULATORY_CARE_PROVIDER_SITE_OTHER): Payer: Medicaid Other | Admitting: Family Medicine

## 2021-03-09 VITALS — BP 137/86 | HR 76 | Wt 194.0 lb

## 2021-03-09 DIAGNOSIS — A749 Chlamydial infection, unspecified: Secondary | ICD-10-CM | POA: Insufficient documentation

## 2021-03-09 DIAGNOSIS — O0990 Supervision of high risk pregnancy, unspecified, unspecified trimester: Secondary | ICD-10-CM | POA: Insufficient documentation

## 2021-03-09 DIAGNOSIS — O24419 Gestational diabetes mellitus in pregnancy, unspecified control: Secondary | ICD-10-CM

## 2021-03-09 NOTE — Progress Notes (Signed)
° °  Subjective:  Sabrina Dixon is a 21 y.o. G1P0 at [redacted]w[redacted]d being seen today for ongoing prenatal care.  She is currently monitored for the following issues for this high-risk pregnancy and has Supervision of high risk pregnancy, antepartum; Gestational diabetes mellitus (GDM) affecting pregnancy; [redacted] weeks gestation of pregnancy; and Chlamydia on their problem list.  Patient reports no complaints.  Contractions: Irritability. Vag. Bleeding: None.  Movement: Present. Denies leaking of fluid.   The following portions of the patient's history were reviewed and updated as appropriate: allergies, current medications, past family history, past medical history, past social history, past surgical history and problem list. Problem list updated.  Objective:   Vitals:   03/09/21 1514  BP: 137/86  Pulse: 76  Weight: 194 lb (88 kg)    Fetal Status: Fetal Heart Rate (bpm): 131   Movement: Present     General:  Alert, oriented and cooperative. Patient is in no acute distress.  Skin: Skin is warm and dry. No rash noted.   Cardiovascular: Normal heart rate noted  Respiratory: Normal respiratory effort, no problems with respiration noted  Abdomen: Soft, gravid, appropriate for gestational age. Pain/Pressure: Present     Pelvic: Vag. Bleeding: None     Cervical exam deferred        Extremities: Normal range of motion.  Edema: Trace  Mental Status: Normal mood and affect. Normal behavior. Normal judgment and thought content.   Urinalysis:      Assessment and Plan:  Pregnancy: G1P0 at [redacted]w[redacted]d  1. Supervision of high risk pregnancy, antepartum BP and FHR normal Needs help with obtaining car seat, referred to [facility]  2. Gestational diabetes mellitus (GDM) affecting pregnancy Very rare low 120's post prandial values, otherwise very well controlled with diet Last growth [place] 03/04/2021 EFW 53%, 2339 g AFI 17  3. Chlamydia TOC today, reported she took treatment on visit on 02/08/21  Preterm labor  symptoms and general obstetric precautions including but not limited to vaginal bleeding, contractions, leaking of fluid and fetal movement were reviewed in detail with the patient. Please refer to After Visit Summary for other counseling recommendations.  Return in 2 weeks (on 03/23/2021) for [facility], ob visit, needs MD.   Clarnce Flock, MD

## 2021-03-09 NOTE — Patient Instructions (Signed)

## 2021-03-10 LAB — GC/CHLAMYDIA PROBE AMP (~~LOC~~) NOT AT ARMC
Chlamydia: POSITIVE — AB
Comment: NEGATIVE
Comment: NORMAL
Neisseria Gonorrhea: NEGATIVE

## 2021-03-11 ENCOUNTER — Telehealth: Payer: Self-pay

## 2021-03-11 NOTE — Telephone Encounter (Addendum)
-----   Message from Clarnce Flock, MD sent at 03/11/2021 12:08 PM EST ----- Persistently positive Please call patient to notify and let her know that re-treatment will be necessary  Left message stating that I am calling with results if she could please give the office a call back.  STD card faxed to [facility].    Frances Nickels  03/11/21

## 2021-03-16 MED ORDER — AZITHROMYCIN 250 MG PO TABS: 1000.0000 mg | ORAL_TABLET | Freq: Once | ORAL | 0 refills | Status: AC

## 2021-03-16 NOTE — Telephone Encounter (Signed)
Called pt and left message that I am calling about results and this is our second attempt. Requested for pt to give office a call and to pick up medication from [facility] on [address]. MyChart message sent.    Leonette Nutting  03/16/21

## 2021-03-16 NOTE — Addendum Note (Signed)
Addended by: Faythe Casa on: 03/16/2021 02:30 PM   Modules accepted: Orders

## 2021-03-28 ENCOUNTER — Encounter (HOSPITAL_COMMUNITY): Payer: Self-pay | Admitting: Family Medicine

## 2021-03-28 ENCOUNTER — Inpatient Hospital Stay (HOSPITAL_COMMUNITY)
Admission: AD | Admit: 2021-03-28 | Discharge: 2021-04-02 | DRG: 787 | Disposition: A | Discharge status: Home / Self Care | Payer: Medicaid Other | Attending: Obstetrics & Gynecology | Admitting: Obstetrics & Gynecology

## 2021-03-28 ENCOUNTER — Encounter: Payer: Self-pay | Admitting: Obstetrics and Gynecology

## 2021-03-28 ENCOUNTER — Ambulatory Visit (INDEPENDENT_AMBULATORY_CARE_PROVIDER_SITE_OTHER): Payer: Medicaid Other | Admitting: Obstetrics and Gynecology

## 2021-03-28 ENCOUNTER — Other Ambulatory Visit: Payer: Self-pay

## 2021-03-28 VITALS — BP 150/105 | HR 81 | Wt 194.4 lb

## 2021-03-28 DIAGNOSIS — Z3A37 37 weeks gestation of pregnancy: Secondary | ICD-10-CM

## 2021-03-28 DIAGNOSIS — O24420 Gestational diabetes mellitus in childbirth, diet controlled: Secondary | ICD-10-CM | POA: Diagnosis present

## 2021-03-28 DIAGNOSIS — O0990 Supervision of high risk pregnancy, unspecified, unspecified trimester: Secondary | ICD-10-CM | POA: Diagnosis not present

## 2021-03-28 DIAGNOSIS — O24419 Gestational diabetes mellitus in pregnancy, unspecified control: Secondary | ICD-10-CM

## 2021-03-28 DIAGNOSIS — O1413 Severe pre-eclampsia, third trimester: Secondary | ICD-10-CM | POA: Diagnosis present

## 2021-03-28 DIAGNOSIS — O1414 Severe pre-eclampsia complicating childbirth: Principal | ICD-10-CM | POA: Diagnosis present

## 2021-03-28 DIAGNOSIS — O99324 Drug use complicating childbirth: Secondary | ICD-10-CM | POA: Diagnosis present

## 2021-03-28 DIAGNOSIS — A749 Chlamydial infection, unspecified: Secondary | ICD-10-CM | POA: Diagnosis present

## 2021-03-28 DIAGNOSIS — Z20822 Contact with and (suspected) exposure to covid-19: Secondary | ICD-10-CM | POA: Diagnosis present

## 2021-03-28 DIAGNOSIS — D62 Acute posthemorrhagic anemia: Secondary | ICD-10-CM | POA: Diagnosis not present

## 2021-03-28 DIAGNOSIS — O99824 Streptococcus B carrier state complicating childbirth: Secondary | ICD-10-CM | POA: Diagnosis present

## 2021-03-28 DIAGNOSIS — F1290 Cannabis use, unspecified, uncomplicated: Secondary | ICD-10-CM | POA: Diagnosis present

## 2021-03-28 DIAGNOSIS — O9081 Anemia of the puerperium: Secondary | ICD-10-CM | POA: Diagnosis not present

## 2021-03-28 DIAGNOSIS — O24429 Gestational diabetes mellitus in childbirth, unspecified control: Secondary | ICD-10-CM | POA: Diagnosis not present

## 2021-03-28 LAB — CBC
HCT: 29.1 % — ABNORMAL LOW (ref 36.0–46.0)
Hemoglobin: 9.1 g/dL — ABNORMAL LOW (ref 12.0–15.0)
MCH: 21.2 pg — ABNORMAL LOW (ref 26.0–34.0)
MCHC: 31.3 g/dL (ref 30.0–36.0)
MCV: 67.8 fL — ABNORMAL LOW (ref 80.0–100.0)
Platelets: 173 10*3/uL (ref 150–400)
RBC: 4.29 MIL/uL (ref 3.87–5.11)
RDW: 14.6 % (ref 11.5–15.5)
WBC: 8 10*3/uL (ref 4.0–10.5)
nRBC: 0 % (ref 0.0–0.2)

## 2021-03-28 LAB — COMPREHENSIVE METABOLIC PANEL
ALT: 9 U/L (ref 0–44)
AST: 18 U/L (ref 15–41)
Albumin: 2.8 g/dL — ABNORMAL LOW (ref 3.5–5.0)
Alkaline Phosphatase: 232 U/L — ABNORMAL HIGH (ref 38–126)
Anion gap: 10 (ref 5–15)
BUN: 6 mg/dL (ref 6–20)
CO2: 19 mmol/L — ABNORMAL LOW (ref 22–32)
Calcium: 8.6 mg/dL — ABNORMAL LOW (ref 8.9–10.3)
Chloride: 108 mmol/L (ref 98–111)
Creatinine, Ser: 0.57 mg/dL (ref 0.44–1.00)
GFR, Estimated: >60 mL/min (ref >60)
Glucose, Bld: 78 mg/dL (ref 70–99)
Potassium: 3.9 mmol/L (ref 3.5–5.1)
Sodium: 137 mmol/L (ref 135–145)
Total Bilirubin: 0.6 mg/dL (ref 0.3–1.2)
Total Protein: 6.4 g/dL — ABNORMAL LOW (ref 6.5–8.1)

## 2021-03-28 LAB — URINALYSIS, ROUTINE W REFLEX MICROSCOPIC
Bilirubin Urine: NEGATIVE
Glucose, UA: NEGATIVE mg/dL
Hgb urine dipstick: NEGATIVE
Ketones, ur: 20 mg/dL — AB
Nitrite: NEGATIVE
Protein, ur: NEGATIVE mg/dL
Specific Gravity, Urine: 1.009 (ref 1.005–1.030)
pH: 7 (ref 5.0–8.0)

## 2021-03-28 LAB — PROTEIN / CREATININE RATIO, URINE
Creatinine, Urine: 86.77 mg/dL
Protein Creatinine Ratio: 0.3 mg/mg{Cre} — ABNORMAL HIGH (ref 0.00–0.15)
Total Protein, Urine: 26 mg/dL

## 2021-03-28 LAB — RESP PANEL BY RT-PCR (FLU A&B, COVID) ARPGX2
Influenza A by PCR: NEGATIVE
Influenza B by PCR: NEGATIVE
SARS Coronavirus 2 by RT PCR: NEGATIVE

## 2021-03-28 LAB — TYPE AND SCREEN
ABO/RH(D): O POS
Antibody Screen: NEGATIVE

## 2021-03-28 LAB — GLUCOSE, CAPILLARY: Glucose-Capillary: 78 mg/dL (ref 70–99)

## 2021-03-28 MED ORDER — LABETALOL HCL 5 MG/ML IV SOLN: 80.0000 mg | INTRAVENOUS | Status: DC | PRN

## 2021-03-28 MED ORDER — LABETALOL HCL 5 MG/ML IV SOLN
80.0000 mg | INTRAVENOUS | Status: DC | PRN
  Administered 2021-03-30 (×2): 80 mg via INTRAVENOUS
  Filled 2021-03-28: qty 16

## 2021-03-28 MED ORDER — OXYTOCIN BOLUS FROM INFUSION
333.0000 mL | Freq: Once | INTRAVENOUS | Status: DC
Start: 2021-03-28 — End: 2021-03-31

## 2021-03-28 MED ORDER — SOD CITRATE-CITRIC ACID 500-334 MG/5ML PO SOLN
30.0000 mL | ORAL | Status: DC | PRN
Start: 2021-03-28 — End: 2021-03-31
  Administered 2021-03-30: 30 mL via ORAL
  Filled 2021-03-28: qty 30

## 2021-03-28 MED ORDER — LABETALOL HCL 5 MG/ML IV SOLN
20.0000 mg | INTRAVENOUS | Status: DC | PRN
  Administered 2021-03-28: 20 mg via INTRAVENOUS
  Filled 2021-03-28: qty 4

## 2021-03-28 MED ORDER — LIDOCAINE HCL (PF) 1 % IJ SOLN
30.0000 mL | INTRAMUSCULAR | Status: DC | PRN
Start: 2021-03-28 — End: 2021-03-31

## 2021-03-28 MED ORDER — LABETALOL HCL 5 MG/ML IV SOLN
20.0000 mg | INTRAVENOUS | Status: DC | PRN
  Administered 2021-03-29 – 2021-03-30 (×5): 20 mg via INTRAVENOUS
  Filled 2021-03-28 (×5): qty 4

## 2021-03-28 MED ORDER — MAGNESIUM SULFATE BOLUS VIA INFUSION
4.0000 g | Freq: Once | INTRAVENOUS | Status: AC
  Administered 2021-03-28: 4 g via INTRAVENOUS
  Filled 2021-03-28: qty 1000

## 2021-03-28 MED ORDER — LACTATED RINGERS IV SOLN: INTRAVENOUS | Status: DC

## 2021-03-28 MED ORDER — LABETALOL HCL 200 MG PO TABS
200.0000 mg | ORAL_TABLET | Freq: Two times a day (BID) | ORAL | Status: DC
  Administered 2021-03-28 – 2021-03-29 (×2): 200 mg via ORAL
  Filled 2021-03-28 (×2): qty 1

## 2021-03-28 MED ORDER — OXYCODONE-ACETAMINOPHEN 5-325 MG PO TABS: 2.0000 | ORAL_TABLET | ORAL | Status: DC | PRN

## 2021-03-28 MED ORDER — HYDRALAZINE HCL 20 MG/ML IJ SOLN
10.0000 mg | INTRAMUSCULAR | Status: DC | PRN
  Administered 2021-03-30: 10 mg via INTRAVENOUS
  Filled 2021-03-28: qty 1

## 2021-03-28 MED ORDER — HYDRALAZINE HCL 20 MG/ML IJ SOLN: 10.0000 mg | INTRAMUSCULAR | Status: DC | PRN

## 2021-03-28 MED ORDER — OXYTOCIN-SODIUM CHLORIDE 30-0.9 UT/500ML-% IV SOLN
2.5000 [IU]/h | INTRAVENOUS | Status: DC
Start: 2021-03-28 — End: 2021-03-31
  Filled 2021-03-28: qty 500

## 2021-03-28 MED ORDER — ACETAMINOPHEN 325 MG PO TABS: 650.0000 mg | ORAL_TABLET | ORAL | Status: DC | PRN

## 2021-03-28 MED ORDER — LACTATED RINGERS IV SOLN
INTRAVENOUS | Status: DC
  Administered 2021-03-29: 75 mL via INTRAVENOUS

## 2021-03-28 MED ORDER — MAGNESIUM SULFATE 40 GM/1000ML IV SOLN
2.0000 g/h | INTRAVENOUS | Status: AC
  Administered 2021-03-28 – 2021-03-31 (×4): 2 g/h via INTRAVENOUS
  Filled 2021-03-28 (×4): qty 1000

## 2021-03-28 MED ORDER — LACTATED RINGERS IV SOLN: 500.0000 mL | INTRAVENOUS | Status: DC | PRN

## 2021-03-28 MED ORDER — OXYCODONE-ACETAMINOPHEN 5-325 MG PO TABS: 1.0000 | ORAL_TABLET | ORAL | Status: DC | PRN

## 2021-03-28 MED ORDER — LABETALOL HCL 5 MG/ML IV SOLN
40.0000 mg | INTRAVENOUS | Status: DC | PRN
  Administered 2021-03-28: 40 mg via INTRAVENOUS
  Filled 2021-03-28: qty 8

## 2021-03-28 MED ORDER — MISOPROSTOL 50MCG HALF TABLET
50.0000 ug | ORAL_TABLET | ORAL | Status: DC | PRN
  Administered 2021-03-28 (×2): 50 ug via ORAL
  Filled 2021-03-28 (×2): qty 1

## 2021-03-28 MED ORDER — ONDANSETRON HCL 4 MG/2ML IJ SOLN
4.0000 mg | Freq: Four times a day (QID) | INTRAMUSCULAR | Status: DC | PRN
Start: 2021-03-28 — End: 2021-03-31
  Administered 2021-03-29 – 2021-03-30 (×2): 4 mg via INTRAVENOUS
  Filled 2021-03-28 (×2): qty 2

## 2021-03-28 MED ORDER — TERBUTALINE SULFATE 1 MG/ML IJ SOLN
0.2500 mg | Freq: Once | INTRAMUSCULAR | Status: DC | PRN
  Filled 2021-03-28: qty 1

## 2021-03-28 MED ORDER — LABETALOL HCL 5 MG/ML IV SOLN
40.0000 mg | INTRAVENOUS | Status: DC | PRN
  Administered 2021-03-30 (×2): 40 mg via INTRAVENOUS
  Filled 2021-03-28 (×4): qty 8

## 2021-03-28 NOTE — H&P (Signed)
OBSTETRIC ADMISSION HISTORY AND PHYSICAL  Sabrina Dixon is a 21 y.o. female G1P0 with IUP at [redacted]w[redacted]d by 6 week ultrasound presenting for IOL for new onset hypertension at term. She reports +FMs, No LOF, no VB, no blurry vision, headaches or peripheral edema, and RUQ pain.  She plans on breast feeding. She request none for birth control. She received her prenatal care at [facility]   Dating: By 6 week ultrasound --->  Estimated Date of Delivery: 04/17/21  Sono:    @33w , CWD, normal anatomy, cephalic presentation, 2339g, EFW   Nursing Staff Provider  Office Location  [facility] Dating  6wk 12%  Language  English Anatomy US  normal  Flu Vaccine  Declined Genetic/Carrier Screen  NIPS:   Low risk AFP:   Not done Horizon: not done  TDaP Vaccine   Declined  Hgb A1C or  GTT Early - abnormal, failed 3 hr GTT Third trimester   COVID Vaccine declined   LAB RESULTS   Rhogam  n/a Blood Type O/Positive/-- (11/03 0000)   Baby Feeding Plan Breast Antibody Negative (11/03 0000)  Contraception None  Rubella Immune, Immune (11/03 0000)  Circumcision Girl RPR Nonreactive (11/03 0000)   Pediatrician  List given HBsAg Negative, Negative (11/03 0000)   Support Person Partner and Mom HCVAb neg  Prenatal Classes  HIV Non Reactive (07/24 0320)     BTL Consent  GBS   (For PCN allergy, check sensitivities)   VBAC Consent  Pap n/a       DME Rx [ x] BP cuff [ x] Weight Scale Waterbirth  [ ]  Class [ ]  Consent [ ]  CNM visit  PHQ9 & GAD7 [  x] new OB [  x] 28 weeks  [  ] 36 weeks Induction  [ ]  Orders Entered [ ] Foley Y/N   Prenatal History/Complications: A1GDM, gonorrhea and chlamydia in pregnancy  Past Medical History: Past Medical History:  Diagnosis Date   Asthma    Chlamydia    GC (gonococcus)     Past Surgical History: Past Surgical History:  Procedure Laterality Date   NO PAST SURGERIES      Obstetrical History: OB History     Gravida  1   Para      Term      Preterm      AB       Living         SAB      IAB      Ectopic      Multiple      Live Births              Social History Social History   Socioeconomic History   Marital status: Single    Spouse name: Not on file   Number of children: Not on file   Years of education: Not on file   Highest education level: Not on file  Occupational History   Not on file  Tobacco Use   Smoking status: Never   Smokeless tobacco: Never  Vaping Use   Vaping Use: Former   Substances: Flavoring  Substance and Sexual Activity   Alcohol use: Not Currently   Drug use: Yes    Types: Marijuana    Comment: LAST USED 2-3 MONTHS AGO   Sexual activity: Yes    Comment: 08/20/2020  Other Topics Concern   Not on file  Social History Narrative   Not on file   Social Determinants of Health   Financial Resource  Strain: Not on file  Food Insecurity: Not on file  Transportation Needs: Not on file  Physical Activity: Not on file  Stress: Not on file  Social Connections: Not on file    Family History: Family History  Problem Relation Age of Onset   Hypertension Mother    Cancer Neg Hx    Diabetes Neg Hx     Allergies: No Known Allergies  Medications Prior to Admission  Medication Sig Dispense Refill Last Dose   Prenatal Vit-Fe Fumarate-FA (MULTIVITAMIN-PRENATAL) 27-0.8 MG TABS tablet Take 1 tablet by mouth daily at 12 noon.   03/28/2021   Accu-Chek Softclix Lancets lancets Use as instructed 100 each 12    glucose blood (ACCU-CHEK GUIDE) test strip Use as instructed 100 each 12      Review of Systems   All systems reviewed and negative except as stated in HPI  Blood pressure (!) 143/102, pulse 75, temperature 97.9 F (36.6 C), temperature source Oral, resp. rate 20, height 5' (1.524 m), weight 88.1 kg, last menstrual period 05/30/2020, SpO2 99 %.  Patient Vitals for the past 24 hrs:  BP Temp Temp src Pulse Resp SpO2 Height Weight  03/28/21 1400 (!) 143/102 -- -- 75 -- 99 % -- --  03/28/21 1345  (!) 161/99 -- -- 76 -- 98 % -- --  03/28/21 1332 (!) 161/96 97.9 F (36.6 C) Oral 72 20 99 % -- --  03/28/21 1317 -- -- -- -- -- -- 5' (1.524 m) 88.1 kg     General appearance: alert, cooperative, and no distress Lungs: clear to auscultation bilaterally Heart: regular rate and rhythm Abdomen: soft, non-tender; bowel sounds normal Pelvic: n/a Extremities: Homans sign is negative, no sign of DVT DTR's +2 Presentation: cephalic Fetal monitoringBaseline: 125 bpm, Variability: Good {> 6 bpm), Accelerations: Reactive, and Decelerations: Absent Uterine activityFrequency: Every 5-7 minutes     Prenatal labs: ABO, Rh: O/Positive/-- (11/03 0000) Antibody: Negative (11/03 0000) Rubella: Immune, Immune (11/03 0000) RPR: Nonreactive (11/03 0000)  HBsAg: Negative, Negative (11/03 0000)  HIV: Non Reactive (07/24 0320)  GBS:   pending  Maternal Diabetes: Yes:  Diabetes Type:  Diet controlled Genetic Screening: Normal Maternal Ultrasounds/Referrals: Normal Fetal Ultrasounds or other Referrals:  Referred to Materal Fetal Medicine  Maternal Substance Abuse:  No Significant Maternal Medications:  None Significant Maternal Lab Results: None  No results found for this or any previous visit (from the past 24 hour(s)).  Patient Active Problem List   Diagnosis Date Noted   Chlamydia    Supervision of high risk pregnancy, antepartum 01/11/2021   Gestational diabetes mellitus (GDM) affecting pregnancy 01/11/2021    Assessment/Plan:  Sabrina Dixon is a 21 y.o. G1P0 at [redacted]w[redacted]d here for IOL for new onset HTN at term. Upon arrival to MAU, patient with severe range BPs needing IV treatment. Patient asymptomatic and labs pending. Consulted with Dr. Adrian Blackwater who recommended admission for IOL  #Labor: methods of IOL discussed at length.  #Pain: Options reviewed #FWB: Cat 1 #ID:  GBS pending #MOF: Breast #MOC: none #Circ:  N/a  Rolm Bookbinder, CNM  03/28/2021, 2:09 PM

## 2021-03-28 NOTE — Progress Notes (Signed)
Labor Progress Note Keegan Bensch is a 21 y.o. G1P0 at [redacted]w[redacted]d presented for IOL due to Pre-e with SF.   S: Doing well, trying to rest.   O:  BP 124/66    Pulse 65    Temp 98.5 F (36.9 C) (Oral)    Resp 17    Ht 5' (1.524 m)    Wt 89.4 kg    LMP 05/30/2020 (Approximate)    SpO2 99%    BMI 38.47 kg/m  EFM: 140/min to mod/none/none  CVE: Dilation: Closed Effacement (%): Thick Cervical Position: Posterior Presentation: Vertex Exam by:: Dr. Annia Friendly   A&P: 21 y.o. G1P0 [redacted]w[redacted]d  #Labor: largely unchanged, will give additional buccal cytotec (now x2). Hopeful for FB on the next check if able to get through to internal os.  #Pain: PRN #FWB: Cat I  #GBS  culture pending. Will add PCR and treat if positive, otherwise will await without any additional RF  #A1GDM: q4 CBGs  #Pre-e with SF: BP stable. No symptoms. Cont mag.    Allayne Stack, DO 8:56 PM

## 2021-03-28 NOTE — Progress Notes (Signed)
Subjective:  Sabrina Dixon is a 21 y.o. G1P0 at [redacted]w[redacted]d being seen today for ongoing prenatal care.  She is currently monitored for the following issues for this high-risk pregnancy and has Supervision of high risk pregnancy, antepartum; Gestational diabetes mellitus (GDM) affecting pregnancy; and Chlamydia on their problem list.  Patient reports General discomforts of pregnancy. Denies HA or blurry visiio..  Contractions: Irritability. Vag. Bleeding: None.  Movement: Present. Denies leaking of fluid.   The following portions of the patient's history were reviewed and updated as appropriate: allergies, current medications, past family history, past medical history, past social history, past surgical history and problem list. Problem list updated.  Objective:   Vitals:   03/28/21 1129 03/28/21 1133  BP: (!) 151/104 (!) 150/105  Pulse: 88 81  Weight: 194 lb 6.4 oz (88.2 kg)     Fetal Status: Fetal Heart Rate (bpm): 149   Movement: Present     General:  Alert, oriented and cooperative. Patient is in no acute distress.  Skin: Skin is warm and dry. No rash noted.   Cardiovascular: Normal heart rate noted  Respiratory: Normal respiratory effort, no problems with respiration noted  Abdomen: Soft, gravid, appropriate for gestational age. Pain/Pressure: Present     Pelvic:  Cervical exam performed        Extremities: Normal range of motion.  Edema: Trace  Mental Status: Normal mood and affect. Normal behavior. Normal judgment and thought content.   Urinalysis:      Assessment and Plan:  Pregnancy: G1P0 at [redacted]w[redacted]d  1. Supervision of high risk pregnancy, antepartum Labor precautions - Culture, beta strep (group b only)  2. Gestational diabetes mellitus (GDM) affecting pregnancy CBG's in goal range  BP elevated. Pt's car broke down this morning on way to appt. Maybe situational but will send to MAU for further evaluation. Sam in MAU made aware.   Term labor symptoms and general obstetric  precautions including but not limited to vaginal bleeding, contractions, leaking of fluid and fetal movement were reviewed in detail with the patient. Please refer to After Visit Summary for other counseling recommendations.  No follow-ups on file.   Chancy Milroy, MD

## 2021-03-28 NOTE — MAU Note (Signed)
Pt sent from [facility] office for BP evaluation.  Denies H/A, epigastric pain, and visual disturbances.  Endorses +FM.  Denies LOF and VB, states spotting since vaginal exam @ office.

## 2021-03-28 NOTE — Progress Notes (Signed)
Pt has Glucose log today. 

## 2021-03-28 NOTE — Progress Notes (Signed)
Labor Progress Note Marlana Mckowen is a 21 y.o. G1P0 at [redacted]w[redacted]d presented for PEC w/SF  S:  Comfortable, no c/o.   O:  BP (!) 143/97    Pulse 83    Temp 97.9 F (36.6 C) (Axillary)    Resp 18    Ht 5' (1.524 m)    Wt 89.4 kg    LMP 05/30/2020 (Approximate)    SpO2 99%    BMI 38.47 kg/m  EFM: baseline 135 bpm/ mod variability/ + accels/ no decels  Toco/IUPC: rare SVE: Dilation: Closed Effacement (%): Thick Cervical Position: Posterior Presentation: Undeterminable Exam by:: MBhambri,CNM (VERTEX BY BEDSIDE US)   A/P: 21 y.o. G1P0 [redacted]w[redacted]d  1. Labor: latent 2. FWB: Cat I 3. Pain: analgesia/anesthesia/NO prn 4. PEC: stable on Mg 5. GDM: stable  Start ripening with Cytotec. Place FB when able. Anticipate SVD.  Donette Larry, CNM 4:14 PM

## 2021-03-28 NOTE — Patient Instructions (Addendum)
Vaginal Delivery ?Vaginal delivery means that you give birth by pushing your baby out of your birth canal (vagina). Your health care team will help you before, during, and after vaginal delivery. ?Birth experiences are unique for every woman and every pregnancy, and birth experiences vary depending on where you choose to give birth. ?What are the risks and benefits? ?Generally, this is safe. However, problems may occur, including: ?Bleeding. ?Infection. ?Damage to other structures such as vaginal tearing. ?Allergic reactions to medicines. ?Despite the risks, benefits of vaginal delivery include less risk of bleeding and infection and a shorter recovery time compared to a Cesarean delivery. Cesarean delivery, or C-section, is the surgical delivery of a baby. ?What happens when I arrive at the birth center or hospital? ?Once you are in labor and have been admitted into the hospital or birth center, your health care team may: ?Review your pregnancy history and any concerns that you have. ?Talk with you about your birth plan and discuss pain control options. ?Check your blood pressure, breathing, and heartbeat. ?Assess your baby's heartbeat. ?Monitor your uterus for contractions. ?Check whether your bag of water (amniotic sac) has broken (ruptured). ?Insert an IV into one of your veins. This may be used to give you fluids and medicines. ?Monitoring ?Your health care team may assess your contractions (uterine monitoring) and your baby's heart rate (fetal monitoring). You may need to be monitored: ?Often, but not continuously (intermittently). ?All the time or for long periods at a time (continuously). Continuous monitoring may be needed if: ?You are taking certain medicines, such as medicine to relieve pain or make your contractions stronger. ?You have pregnancy or labor complications. ?Monitoring may be done by: ?Placing a special stethoscope or a handheld monitoring device on your abdomen to check your baby's heartbeat  and to check for contractions. ?Placing monitors on your abdomen (external monitors) to record your baby's heartbeat and the frequency and length of contractions. ?Placing monitors inside your uterus through your vagina (internal monitors) to record your baby's heartbeat and the frequency, length, and strength of your contractions. Depending on the type of monitor, it may remain in your uterus or on your baby's head until birth. ?Telemetry. This is a type of continuous monitoring that can be done with external or internal monitors. Instead of having to stay in bed, you are able to move around. ?Physical exam ?Your health care team may perform frequent physical exams. This may include: ?Checking how and where your baby is positioned in your uterus. ?Checking your cervix to determine: ?Whether it is thinning out (effacing). ?Whether it is opening up (dilating). ?What happens during labor and delivery? ?Normal labor and delivery is divided into the following three stages: ?Stage 1 ?This is the longest stage of labor. ?Throughout this stage, you will feel contractions. Contractions generally feel mild, infrequent, and irregular at first. They get stronger, more frequent, and more regular as you move through this stage. You may have contractions about every 2-3 minutes. ?This stage ends when your cervix is completely dilated to 4 inches (10 cm) and completely effaced. ?Stage 2 ?This stage starts once your cervix is completely effaced and dilated and lasts until the delivery of your baby. ?This is the stage where you will feel an urge to push your baby out of your vagina. ?You may feel stretching and burning pain, especially when the widest part of your baby's head passes through the vaginal opening (crowning). ?Once your baby is delivered, the umbilical cord will be clamped and   cut. Timing of cutting the cord will depend on your wishes, your baby's health, and your health care provider's practices. ?Your baby will be  placed on your bare chest (skin-to-skin contact) in an upright position and covered with a warm blanket. If you are choosing to breastfeed, watch your baby for feeding cues, like rooting or sucking, and help the baby to your breast for his or her first feeding. ?Stage 3 ?This stage starts immediately after the birth of your baby and ends after you deliver the placenta. ?This stage may take anywhere from 5 to 30 minutes. ?After your baby has been delivered, you will feel contractions as your body expels the placenta. These contractions also help your uterus get smaller and reduce bleeding. ?What can I expect after labor and delivery? ?After labor is over, you and your baby will be assessed closely until you are ready to go home. Your health care team will teach you how to care for yourself and your baby. ?You and your baby may be encouraged to stay in the same room (rooming in) during your hospital stay. This will help promote early bonding and successful breastfeeding. ?Your uterus will be checked and massaged regularly (fundal massage). ?You may continue to receive fluids and medicines through an IV. ?You will have some soreness and pain in your abdomen, vagina, and the area of skin between your vaginal opening and your anus (perineum). ?If an incision was made near your vagina (episiotomy) or if you had some vaginal tearing during delivery, cold compresses may be placed on your episiotomy or your tear. This helps to reduce pain and swelling. ?It is normal to have vaginal bleeding after delivery. Wear a sanitary pad for vaginal bleeding and discharge. ?Summary ?Vaginal delivery means that you will give birth by pushing your baby out of your birth canal (vagina). ?Your health care team will monitor you and your baby throughout the stages of labor. ?After you deliver your baby, your health care team will continue to assess you and your baby to ensure you are both recovering as expected after delivery. ?This  information is not intended to replace advice given to you by your health care provider. Make sure you discuss any questions you have with your health care provider. ?Document Revised: 12/15/2019 Document Reviewed: 12/15/2019 ?Elsevier Patient Education ? 2022 [employer] ? ?

## 2021-03-29 LAB — GLUCOSE, CAPILLARY
Glucose-Capillary: 103 mg/dL — ABNORMAL HIGH (ref 70–99)
Glucose-Capillary: 115 mg/dL — ABNORMAL HIGH (ref 70–99)
Glucose-Capillary: 124 mg/dL — ABNORMAL HIGH (ref 70–99)
Glucose-Capillary: 125 mg/dL — ABNORMAL HIGH (ref 70–99)
Glucose-Capillary: 89 mg/dL (ref 70–99)
Glucose-Capillary: 94 mg/dL (ref 70–99)

## 2021-03-29 LAB — GC/CHLAMYDIA PROBE AMP (~~LOC~~) NOT AT ARMC
Chlamydia: POSITIVE — AB
Comment: NEGATIVE
Comment: NORMAL
Neisseria Gonorrhea: NEGATIVE

## 2021-03-29 LAB — COMPREHENSIVE METABOLIC PANEL
ALT: 11 U/L (ref 0–44)
AST: 18 U/L (ref 15–41)
Albumin: 2.5 g/dL — ABNORMAL LOW (ref 3.5–5.0)
Alkaline Phosphatase: 236 U/L — ABNORMAL HIGH (ref 38–126)
Anion gap: 9 (ref 5–15)
BUN: <5 mg/dL — ABNORMAL LOW (ref 6–20)
CO2: 19 mmol/L — ABNORMAL LOW (ref 22–32)
Calcium: 7.7 mg/dL — ABNORMAL LOW (ref 8.9–10.3)
Chloride: 105 mmol/L (ref 98–111)
Creatinine, Ser: 0.63 mg/dL (ref 0.44–1.00)
GFR, Estimated: >60 mL/min (ref >60)
Glucose, Bld: 121 mg/dL — ABNORMAL HIGH (ref 70–99)
Potassium: 3.8 mmol/L (ref 3.5–5.1)
Sodium: 133 mmol/L — ABNORMAL LOW (ref 135–145)
Total Bilirubin: 0.3 mg/dL (ref 0.3–1.2)
Total Protein: 6.1 g/dL — ABNORMAL LOW (ref 6.5–8.1)

## 2021-03-29 LAB — CBC
HCT: 29.2 % — ABNORMAL LOW (ref 36.0–46.0)
Hemoglobin: 9.1 g/dL — ABNORMAL LOW (ref 12.0–15.0)
MCH: 20.9 pg — ABNORMAL LOW (ref 26.0–34.0)
MCHC: 31.2 g/dL (ref 30.0–36.0)
MCV: 67 fL — ABNORMAL LOW (ref 80.0–100.0)
Platelets: 185 10*3/uL (ref 150–400)
RBC: 4.36 MIL/uL (ref 3.87–5.11)
RDW: 14.8 % (ref 11.5–15.5)
WBC: 9.3 10*3/uL (ref 4.0–10.5)
nRBC: 0 % (ref 0.0–0.2)

## 2021-03-29 LAB — MAGNESIUM: Magnesium: 5.7 mg/dL — ABNORMAL HIGH (ref 1.7–2.4)

## 2021-03-29 LAB — RPR: RPR Ser Ql: NONREACTIVE

## 2021-03-29 LAB — GROUP B STREP BY PCR: Group B strep by PCR: POSITIVE — AB

## 2021-03-29 MED ORDER — MISOPROSTOL 25 MCG QUARTER TABLET
25.0000 ug | ORAL_TABLET | ORAL | Status: DC | PRN
  Administered 2021-03-29 (×2): 25 ug via VAGINAL
  Filled 2021-03-29 (×2): qty 1

## 2021-03-29 MED ORDER — OXYTOCIN-SODIUM CHLORIDE 30-0.9 UT/500ML-% IV SOLN
1.0000 m[IU]/min | INTRAVENOUS | Status: DC
  Administered 2021-03-29: 2 m[IU]/min via INTRAVENOUS

## 2021-03-29 MED ORDER — MISOPROSTOL 50MCG HALF TABLET
50.0000 ug | ORAL_TABLET | ORAL | Status: DC | PRN
  Administered 2021-03-29: 50 ug via ORAL
  Filled 2021-03-29: qty 1

## 2021-03-29 MED ORDER — LABETALOL HCL 200 MG PO TABS
200.0000 mg | ORAL_TABLET | Freq: Three times a day (TID) | ORAL | Status: DC
  Administered 2021-03-29 – 2021-03-30 (×4): 200 mg via ORAL
  Filled 2021-03-29 (×4): qty 1

## 2021-03-29 MED ORDER — FENTANYL CITRATE (PF) 100 MCG/2ML IJ SOLN
100.0000 ug | INTRAMUSCULAR | Status: DC | PRN
  Administered 2021-03-29 – 2021-03-30 (×3): 100 ug via INTRAVENOUS
  Filled 2021-03-29 (×3): qty 2

## 2021-03-29 MED ORDER — SODIUM CHLORIDE 0.9 % IV SOLN
5.0000 10*6.[IU] | Freq: Once | INTRAVENOUS | Status: AC
  Administered 2021-03-29: 5 10*6.[IU] via INTRAVENOUS
  Filled 2021-03-29: qty 5

## 2021-03-29 MED ORDER — PENICILLIN G POT IN DEXTROSE 60000 UNIT/ML IV SOLN
3.0000 10*6.[IU] | INTRAVENOUS | Status: DC
  Administered 2021-03-29 – 2021-03-30 (×7): 3 10*6.[IU] via INTRAVENOUS
  Filled 2021-03-29 (×8): qty 50

## 2021-03-29 MED ORDER — FENTANYL CITRATE (PF) 100 MCG/2ML IJ SOLN
INTRAMUSCULAR | Status: AC
Start: 2021-03-29 — End: 2021-03-30
  Administered 2021-03-30: 100 ug via INTRAVENOUS
  Filled 2021-03-29: qty 2

## 2021-03-29 MED ORDER — TERBUTALINE SULFATE 1 MG/ML IJ SOLN: 0.2500 mg | Freq: Once | INTRAMUSCULAR | Status: DC | PRN

## 2021-03-29 NOTE — Progress Notes (Addendum)
Labor Progress Note Sabrina Dixon is a 21 y.o. G1P0 at [redacted]w[redacted]d presented for IOL due to Pre-E with severe features.   S: Feeling intermittent contractions, denies any pain.   O:  BP (!) 143/94 (BP Location: Right Arm)    Pulse 72    Temp 98.1 F (36.7 C) (Oral)    Resp 16    Ht 5' (1.524 m)    Wt 89.4 kg    LMP 05/30/2020 (Approximate)    SpO2 99%    BMI 38.47 kg/m   EFM: Baseline FHR 120 bpm/minimal to moderate variability/ no accels, no decels  CVE: Dilation: 3 Effacement (%): 40, 50 Cervical Position: Anterior Station: Ballotable Presentation: Vertex Exam by:: Reino Bellis, RN & Dr. Tessie Fass  A&P: 21 y.o. G1P0 [redacted]w[redacted]d   #Labor: Foley balloon out now. Progressing well. S/p Cytotec x5. Will transition to Pitocin 2x2. Plan to reassess in 4 hours.  #Pain: PRN, planning for epidural when ready  #FWB: Intermittently Cat 2 due to minimal variability. On Magnesium. Reassuringly, has intermittent moderate variability and accels in between. Will continue to monitor. #GBS: PCR positive, PCN   #Pre-E w/ SF: Several severe range BP's requiring PRN Labetalol. Remains asymptomatic. On Mag. Will increase PO Labetalol to 200 mg TID. CBC, CMP, and Mg level pending. Will continue to monitor.    #A1GDM: CBG stable. Continue q4h glucose checks during latent labor.   Karsten Fells, DO PGY-1 03/29/2021, 8:29 PM  GME ATTESTATION:  I saw and evaluated the patient. I agree with the findings and the plan of care as documented in the residents note. I have made changes to documentation as necessary.  Vilma Meckel, MD OB Fellow, [place] [facility] [place] 03/29/2021 8:40 PM

## 2021-03-29 NOTE — Progress Notes (Signed)
Labor Progress Note Sabrina Dixon is a 21 y.o. G1P0 at [redacted]w[redacted]d presented for IOL due to pre-e with SF.   S: Doing well. Starting to feel her contractions.   O:  BP 140/83    Pulse 71    Temp 98.3 F (36.8 C) (Oral)    Resp 18    Ht 5' (1.524 m)    Wt 89.4 kg    LMP 05/30/2020 (Approximate)    SpO2 99%    BMI 38.47 kg/m  EFM: 130/mod/15x15/none  CVE: Dilation: Closed Effacement (%): Thick Cervical Position: Posterior Station: Ballotable Presentation: Vertex Exam by:: Dr Annia Friendly   A&P: 21 y.o. G1P0 [redacted]w[redacted]d  #Labor: Largely unchanged. Placed fourth cytotec (2 buccal/2 vaginal). Hopeful for internal os to open by next check and place FB.  #Pain: PRN #FWB: Cat I  #GBS positive>will start PCN  #Pre-e with SF: Has been mild range overnight, however needed IV labetalol this morning. Cont labetalol oral BID with mag. No symptoms. Pre-e labs WNL.   Allayne Stack, DO

## 2021-03-29 NOTE — Progress Notes (Signed)
Labor Progress Note Sabrina Dixon is a 21 y.o. G1P0 at [redacted]w[redacted]d presented for IOL due to Pre-E with SF in the setting of elevated blood pressures.   S: Resting comfortably in bed, feeling some cramping discomfort with contractions.  O:  BP (!) 152/105    Pulse 85    Temp 98 F (36.7 C) (Oral)    Resp 19    Ht 5' (1.524 m)    Wt 89.4 kg    LMP 05/30/2020 (Approximate)    SpO2 99%    BMI 38.47 kg/m  EFM: Baseline FHR 130 bpm/moderate variability/+accels, no decels Toco: Regular, every 1-2 minutes  CVE: Dilation: 2 Effacement (%): Thick Cervical Position: Anterior Station: Ballotable, -3 Presentation: Vertex Exam by:: MD   A&P: 21 y.o. G1P0 [redacted]w[redacted]d  #Labor: Progressing well. Foley balloon placed without complication. S/p cytotec x5. Given frequent contraction pattern, will hold on additional dose at this time. Plan to add additional buccal cytotec when appropriate. Will reassess in 4-5 hours.  #Pain: PRN, planning for epidural #FWB: Cat 1 #GBS: PCR positive, PCN given  #Pre-E w/ SF: Mild to moderate range BP. Remains asymptomatic. On mag. Continue PO labetalol 200 mg BID. IV labetalol and hydralazine per protocol PRN. Will continue to monitor.   #A1GDM: CBG stable. Continue q4h glucose checks.   Anselm Pancoast, DO PGY-1 03/29/2021, 3:38 PM

## 2021-03-29 NOTE — Progress Notes (Signed)
Labor Progress Note Gesenia Bantz is a 21 y.o. G1P0 at [redacted]w[redacted]d presented for IOL due to pre-e with SF.  S: Checked by RN, reports doing well.   O:  BP 137/75    Pulse 82    Temp 97.9 F (36.6 C) (Oral)    Resp 17    Ht 5' (1.524 m)    Wt 89.4 kg    LMP 05/30/2020 (Approximate)    SpO2 99%    BMI 38.47 kg/m  EFM: 130/mod/15x15  CVE: Dilation: Closed Effacement (%): Thick Cervical Position: Posterior Presentation: Vertex   A&P: 21 y.o. G1P0 [redacted]w[redacted]d  #Labor: Cervical exam by RN and unchanged. Placed vaginal cytotec, now s/p 3. FB when able.  #Pain: PRN  #FWB: Cat I  #GBS PCR and culture pending. No current abx.   #Pre-e with SF: Currently normotensive. Pre-e labs WNL. No symptoms. Cont mag and labetalol.  Allayne Stack, DO 2:23 AM

## 2021-03-30 ENCOUNTER — Inpatient Hospital Stay (HOSPITAL_COMMUNITY): Payer: Medicaid Other | Admitting: Anesthesiology

## 2021-03-30 ENCOUNTER — Encounter (HOSPITAL_COMMUNITY)
Admission: AD | Disposition: A | Discharge status: Home / Self Care | Payer: Self-pay | Source: Home / Self Care | Attending: Obstetrics & Gynecology

## 2021-03-30 ENCOUNTER — Encounter (HOSPITAL_COMMUNITY): Payer: Self-pay | Admitting: Family Medicine

## 2021-03-30 DIAGNOSIS — Z3A37 37 weeks gestation of pregnancy: Secondary | ICD-10-CM

## 2021-03-30 DIAGNOSIS — O1414 Severe pre-eclampsia complicating childbirth: Secondary | ICD-10-CM

## 2021-03-30 DIAGNOSIS — O99824 Streptococcus B carrier state complicating childbirth: Secondary | ICD-10-CM

## 2021-03-30 DIAGNOSIS — O24420 Gestational diabetes mellitus in childbirth, diet controlled: Secondary | ICD-10-CM

## 2021-03-30 DIAGNOSIS — O24429 Gestational diabetes mellitus in childbirth, unspecified control: Secondary | ICD-10-CM

## 2021-03-30 LAB — COMPREHENSIVE METABOLIC PANEL
ALT: 10 U/L (ref 0–44)
AST: 20 U/L (ref 15–41)
Albumin: 2.5 g/dL — ABNORMAL LOW (ref 3.5–5.0)
Alkaline Phosphatase: 259 U/L — ABNORMAL HIGH (ref 38–126)
Anion gap: 8 (ref 5–15)
BUN: <5 mg/dL — ABNORMAL LOW (ref 6–20)
CO2: 19 mmol/L — ABNORMAL LOW (ref 22–32)
Calcium: 7.8 mg/dL — ABNORMAL LOW (ref 8.9–10.3)
Chloride: 105 mmol/L (ref 98–111)
Creatinine, Ser: 0.71 mg/dL (ref 0.44–1.00)
GFR, Estimated: >60 mL/min (ref >60)
Glucose, Bld: 113 mg/dL — ABNORMAL HIGH (ref 70–99)
Potassium: 4.2 mmol/L (ref 3.5–5.1)
Sodium: 132 mmol/L — ABNORMAL LOW (ref 135–145)
Total Bilirubin: 0.6 mg/dL (ref 0.3–1.2)
Total Protein: 6.4 g/dL — ABNORMAL LOW (ref 6.5–8.1)

## 2021-03-30 LAB — CBC WITH DIFFERENTIAL/PLATELET
Abs Immature Granulocytes: 0.11 10*3/uL — ABNORMAL HIGH (ref 0.00–0.07)
Basophils Absolute: 0 10*3/uL (ref 0.0–0.1)
Basophils Relative: 0 %
Eosinophils Absolute: 0 10*3/uL (ref 0.0–0.5)
Eosinophils Relative: 0 %
HCT: 31 % — ABNORMAL LOW (ref 36.0–46.0)
Hemoglobin: 9.8 g/dL — ABNORMAL LOW (ref 12.0–15.0)
Immature Granulocytes: 1 %
Lymphocytes Relative: 7 %
Lymphs Abs: 0.8 10*3/uL (ref 0.7–4.0)
MCH: 21 pg — ABNORMAL LOW (ref 26.0–34.0)
MCHC: 31.6 g/dL (ref 30.0–36.0)
MCV: 66.5 fL — ABNORMAL LOW (ref 80.0–100.0)
Monocytes Absolute: 0.6 10*3/uL (ref 0.1–1.0)
Monocytes Relative: 4 %
Neutro Abs: 10.9 10*3/uL — ABNORMAL HIGH (ref 1.7–7.7)
Neutrophils Relative %: 88 %
Platelets: 194 10*3/uL (ref 150–400)
RBC: 4.66 MIL/uL (ref 3.87–5.11)
RDW: 14.9 % (ref 11.5–15.5)
WBC: 12.4 10*3/uL — ABNORMAL HIGH (ref 4.0–10.5)
nRBC: 0 % (ref 0.0–0.2)

## 2021-03-30 LAB — MAGNESIUM: Magnesium: 6.2 mg/dL (ref 1.7–2.4)

## 2021-03-30 LAB — GLUCOSE, CAPILLARY
Glucose-Capillary: 101 mg/dL — ABNORMAL HIGH (ref 70–99)
Glucose-Capillary: 114 mg/dL — ABNORMAL HIGH (ref 70–99)
Glucose-Capillary: 123 mg/dL — ABNORMAL HIGH (ref 70–99)
Glucose-Capillary: 134 mg/dL — ABNORMAL HIGH (ref 70–99)
Glucose-Capillary: 142 mg/dL — ABNORMAL HIGH (ref 70–99)
Glucose-Capillary: 145 mg/dL — ABNORMAL HIGH (ref 70–99)
Glucose-Capillary: 147 mg/dL — ABNORMAL HIGH (ref 70–99)

## 2021-03-30 SURGERY — Surgical Case
Anesthesia: Epidural

## 2021-03-30 MED ORDER — INSULIN ASPART 100 UNIT/ML IJ SOLN
0.0000 [IU] | INTRAMUSCULAR | Status: DC
  Administered 2021-03-30: 2 [IU] via SUBCUTANEOUS

## 2021-03-30 MED ORDER — PHENYLEPHRINE 40 MCG/ML (10ML) SYRINGE FOR IV PUSH (FOR BLOOD PRESSURE SUPPORT): 80.0000 ug | PREFILLED_SYRINGE | INTRAVENOUS | Status: DC | PRN

## 2021-03-30 MED ORDER — EPHEDRINE 5 MG/ML INJ: 10.0000 mg | INTRAVENOUS | Status: DC | PRN

## 2021-03-30 MED ORDER — TRANEXAMIC ACID-NACL 1000-0.7 MG/100ML-% IV SOLN
1000.0000 mg | INTRAVENOUS | Status: AC
  Administered 2021-03-30: 1000 mg via INTRAVENOUS

## 2021-03-30 MED ORDER — LACTATED RINGERS IV SOLN: 500.0000 mL | Freq: Once | INTRAVENOUS | Status: DC

## 2021-03-30 MED ORDER — OXYTOCIN-SODIUM CHLORIDE 30-0.9 UT/500ML-% IV SOLN
INTRAVENOUS | Status: AC
  Filled 2021-03-30: qty 500

## 2021-03-30 MED ORDER — FENTANYL CITRATE (PF) 100 MCG/2ML IJ SOLN
INTRAMUSCULAR | Status: DC | PRN
  Administered 2021-03-30: 100 ug via EPIDURAL

## 2021-03-30 MED ORDER — FENTANYL CITRATE (PF) 100 MCG/2ML IJ SOLN
INTRAMUSCULAR | Status: AC
  Filled 2021-03-30: qty 2

## 2021-03-30 MED ORDER — ONDANSETRON HCL 4 MG/2ML IJ SOLN
INTRAMUSCULAR | Status: AC
  Filled 2021-03-30: qty 2

## 2021-03-30 MED ORDER — PHENYLEPHRINE 40 MCG/ML (10ML) SYRINGE FOR IV PUSH (FOR BLOOD PRESSURE SUPPORT)
PREFILLED_SYRINGE | INTRAVENOUS | Status: AC
  Filled 2021-03-30: qty 10

## 2021-03-30 MED ORDER — PHENYLEPHRINE 40 MCG/ML (10ML) SYRINGE FOR IV PUSH (FOR BLOOD PRESSURE SUPPORT)
80.0000 ug | PREFILLED_SYRINGE | INTRAVENOUS | Status: DC | PRN
Start: 2021-03-30 — End: 2021-03-31
  Administered 2021-03-30 (×2): 80 ug via INTRAVENOUS

## 2021-03-30 MED ORDER — FENTANYL-BUPIVACAINE-NACL 0.5-0.125-0.9 MG/250ML-% EP SOLN
EPIDURAL | Status: AC
  Filled 2021-03-30: qty 250

## 2021-03-30 MED ORDER — LIDOCAINE-EPINEPHRINE (PF) 2 %-1:200000 IJ SOLN
INTRAMUSCULAR | Status: AC
  Filled 2021-03-30: qty 20

## 2021-03-30 MED ORDER — ONDANSETRON HCL 4 MG/2ML IJ SOLN
INTRAMUSCULAR | Status: DC | PRN
  Administered 2021-03-30: 4 mg via INTRAVENOUS

## 2021-03-30 MED ORDER — MORPHINE SULFATE (PF) 0.5 MG/ML IJ SOLN
INTRAMUSCULAR | Status: AC
  Filled 2021-03-30: qty 10

## 2021-03-30 MED ORDER — LACTATED RINGERS IV SOLN
INTRAVENOUS | Status: DC | PRN
Start: 2021-03-30 — End: 2021-03-31

## 2021-03-30 MED ORDER — SODIUM CHLORIDE 0.9 % IV SOLN
INTRAVENOUS | Status: AC
  Filled 2021-03-30: qty 5

## 2021-03-30 MED ORDER — FENTANYL-BUPIVACAINE-NACL 0.5-0.125-0.9 MG/250ML-% EP SOLN
12.0000 mL/h | EPIDURAL | Status: DC | PRN
  Administered 2021-03-30: 12 mL/h via EPIDURAL

## 2021-03-30 MED ORDER — BUPIVACAINE HCL (PF) 0.5 % IJ SOLN
INTRAMUSCULAR | Status: AC
  Filled 2021-03-30: qty 30

## 2021-03-30 MED ORDER — LIDOCAINE-EPINEPHRINE (PF) 2 %-1:200000 IJ SOLN
INTRAMUSCULAR | Status: DC | PRN
  Administered 2021-03-30: 5 mL via EPIDURAL
  Administered 2021-03-30: 3 mL via EPIDURAL
  Administered 2021-03-30: 2 mL via EPIDURAL

## 2021-03-30 MED ORDER — PHENYLEPHRINE HCL (PRESSORS) 10 MG/ML IV SOLN
INTRAVENOUS | Status: DC | PRN
Start: 2021-03-30 — End: 2021-03-31
  Administered 2021-03-30 – 2021-03-31 (×3): 80 ug via INTRAVENOUS

## 2021-03-30 MED ORDER — DIPHENHYDRAMINE HCL 50 MG/ML IJ SOLN: 12.5000 mg | INTRAMUSCULAR | Status: DC | PRN

## 2021-03-30 MED ORDER — CEFAZOLIN SODIUM-DEXTROSE 2-4 GM/100ML-% IV SOLN
2.0000 g | Freq: Once | INTRAVENOUS | Status: AC
  Administered 2021-03-30: 2 g via INTRAVENOUS

## 2021-03-30 MED ORDER — SODIUM CHLORIDE 0.9 % IV SOLN
500.0000 mg | Freq: Once | INTRAVENOUS | Status: AC
  Administered 2021-03-30: 500 mg via INTRAVENOUS

## 2021-03-30 MED ORDER — LIDOCAINE HCL (PF) 1 % IJ SOLN
INTRAMUSCULAR | Status: DC | PRN
  Administered 2021-03-30: 4 mL via EPIDURAL
  Administered 2021-03-30: 6 mL via EPIDURAL

## 2021-03-30 SURGICAL SUPPLY — 39 items
APL SKNCLS STERI-STRIP NONHPOA (GAUZE/BANDAGES/DRESSINGS) ×1
BARRIER ADHS 3X4 INTERCEED (GAUZE/BANDAGES/DRESSINGS) IMPLANT
BENZOIN TINCTURE PRP APPL 2/3 (GAUZE/BANDAGES/DRESSINGS) ×1 IMPLANT
BRR ADH 4X3 ABS CNTRL BYND (GAUZE/BANDAGES/DRESSINGS)
CHLORAPREP W/TINT 26ML (MISCELLANEOUS) ×2 IMPLANT
CLAMP CORD UMBIL (MISCELLANEOUS) IMPLANT
CLOSURE STERI STRIP 1/2 X4 (GAUZE/BANDAGES/DRESSINGS) ×1 IMPLANT
CLOTH BEACON ORANGE TIMEOUT ST (SAFETY) ×2 IMPLANT
DRSG OPSITE POSTOP 4X10 (GAUZE/BANDAGES/DRESSINGS) ×2 IMPLANT
ELECT REM PT RETURN 9FT ADLT (ELECTROSURGICAL) ×2
ELECTRODE REM PT RTRN 9FT ADLT (ELECTROSURGICAL) ×1 IMPLANT
EXTRACTOR VACUUM KIWI (MISCELLANEOUS) ×1 IMPLANT
GAUZE SPONGE 4X4 12PLY STRL LF (GAUZE/BANDAGES/DRESSINGS) ×2 IMPLANT
GLOVE BIO SURGEON STRL SZ 6.5 (GLOVE) ×2 IMPLANT
GLOVE BIOGEL PI IND STRL 7.0 (GLOVE) ×2 IMPLANT
GLOVE BIOGEL PI INDICATOR 7.0 (GLOVE) ×2
GOWN STRL REUS W/TWL LRG LVL3 (GOWN DISPOSABLE) ×4 IMPLANT
KIT ABG SYR 3ML LUER SLIP (SYRINGE) ×1 IMPLANT
NDL HYPO 25X5/8 SAFETYGLIDE (NEEDLE) IMPLANT
NEEDLE HYPO 22GX1.5 SAFETY (NEEDLE) IMPLANT
NEEDLE HYPO 25X5/8 SAFETYGLIDE (NEEDLE) ×2 IMPLANT
NS IRRIG 1000ML POUR BTL (IV SOLUTION) ×2 IMPLANT
PACK C SECTION WH (CUSTOM PROCEDURE TRAY) ×2 IMPLANT
PAD ABD 7.5X8 STRL (GAUZE/BANDAGES/DRESSINGS) ×1 IMPLANT
PAD OB MATERNITY 4.3X12.25 (PERSONAL CARE ITEMS) ×2 IMPLANT
PENCIL SMOKE EVAC W/HOLSTER (ELECTROSURGICAL) ×2 IMPLANT
RETRACTOR WND ALEXIS 25 LRG (MISCELLANEOUS) IMPLANT
RTRCTR WOUND ALEXIS 25CM LRG (MISCELLANEOUS)
SUT PLAIN 2 0 (SUTURE) ×2
SUT PLAIN ABS 2-0 CT1 27XMFL (SUTURE) IMPLANT
SUT VIC AB 0 CT1 36 (SUTURE) ×12 IMPLANT
SUT VIC AB 2-0 CT1 27 (SUTURE) ×2
SUT VIC AB 2-0 CT1 TAPERPNT 27 (SUTURE) ×1 IMPLANT
SUT VIC AB 4-0 KS 27 (SUTURE) ×1 IMPLANT
SUT VIC AB 4-0 PS2 27 (SUTURE) ×2 IMPLANT
SYR CONTROL 10ML LL (SYRINGE) IMPLANT
TOWEL OR 17X24 6PK STRL BLUE (TOWEL DISPOSABLE) ×2 IMPLANT
TRAY FOLEY W/BAG SLVR 14FR LF (SET/KITS/TRAYS/PACK) IMPLANT
WATER STERILE IRR 1000ML POUR (IV SOLUTION) ×3 IMPLANT

## 2021-03-30 NOTE — Progress Notes (Signed)
Labor Progress Note ?Sabrina Dixon is a 21 y.o. G1P0 at [redacted]w[redacted]d presented for IOL due to pre-e with SF.  ? ?S: Comfortable, feeling some cramping but has been sleeping.  ? ?O:  ?BP (!) 142/85   Pulse 93   Temp 97.9 ?F (36.6 ?C) (Oral)   Resp 16   Ht 5' (1.524 m)   Wt 89.4 kg   LMP 05/30/2020 (Approximate)   SpO2 99%   BMI 38.47 kg/m?  ?EFM: 120/min to mod/none/none ? ?CVE: Dilation: 3.5 ?Effacement (%): 50 ?Cervical Position: Anterior ?Station: -3 ?Presentation: Vertex ?Exam by:: Adrienne Mocha, MD ? ? ?A&P: 21 y.o. G1P0 [redacted]w[redacted]d  ?#Labor: Minimal change since last check >6 hours ago. Had large amount of SROM with clear fluid around 0015. Pit currently at 30. Will continue to titrate up as tolerated.  ?#Pain: PRN ?#FWB: Cat II with minimal var in the setting of Mg infusion  ?#GBS positive PCN ? ?#Pre-e with SF: Currently mild range, but needed labtetalol x2. Pre-e labs stable. No symptoms. Increased oral labetalol from BID to TID. Cont Mag. Monitor closely.  ? ?Sabrina Clan, DO ?2:41 AM  ?

## 2021-03-30 NOTE — Progress Notes (Signed)
Sabrina Dixon is a 21 y.o. G1P0 at [redacted]w[redacted]d admitted for induction of labor due to preE with severe features. ? ?Subjective: ?Patient feeling ok. No pelvic pressure or pain. ? ?Assessed patient at bedside as having recurrent late decelerations. Initially subtle lates for ~40 min and then having deep late variable decelerations to the 50s lasting 1-2 min.  ? ?Objective: ?BP 127/68   Pulse 66   Temp 97.8 ?F (36.6 ?C) (Oral)   Resp 15   Ht 5' (1.524 m)   Wt 89.4 kg   LMP 05/30/2020 (Approximate)   SpO2 95%   BMI 38.47 kg/m?  ?I/O last 3 completed shifts: ?In: 5795.9 [P.O.:1160; I.V.:4148.4; IV Piggyback:487.5] ?Out: 7050 [Urine:7050] ?Total I/O ?In: 409.8 [P.O.:30; I.V.:359.2; Other:20.6] ?Out: 450 [Urine:450] ? ?FHT:  FHR: 120 bpm, variability: minimal (in setting of Mg),  accelerations:  Abscent,  decelerations:  Present recurrent late decelerations  ?UC:   regular, every 6-10 minutes ?SVE:   Dilation: 3.5 ?Effacement (%): 70 ?Station: -3 ?Exam by:: Susa Day, RN ? ?Labs: ?Lab Results  ?Component Value Date  ? WBC 12.4 (H) 03/30/2021  ? HGB 9.8 (L) 03/30/2021  ? HCT 31.0 (L) 03/30/2021  ? MCV 66.5 (L) 03/30/2021  ? PLT 194 03/30/2021  ? ? ?Assessment / Plan: ?G1P0 at [redacted]w[redacted]d admitted for induction of labor due to preE with severe features. ? ?Labor:  patient currently on Day 3 of IOL for preE with SF. Currently not in active labor and cervix at 3.5 cm for ~24 hours despite high doses of pitocin and pitocin break and AROM. Patient did have an adequate contraction pattern intermittently and got more uncomfortable but cervix continues to be 3.5cm. Additionally tracing now consistently Cat II with periods of deep late decelerations that did not resolve despite position changes, fluid bolus, and spacing out contractions. Also with minimal variability in setting of Mg. Given remote from  delivery and at this time fetal intolerance of contractions patient with failed IOL and after shared decision making and discussion with  patient and partner will move toward cesarean delivery. ? ?The risks of cesarean section were discussed with the patient including but were not limited to: bleeding which may require transfusion or reoperation; infection which may require antibiotics; injury to bowel, bladder, ureters or other surrounding organs; injury to the fetus; need for additional procedures including hysterectomy in the event of a life-threatening hemorrhage; placental abnormalities wth subsequent pregnancies, incisional problems, thromboembolic phenomenon and other postoperative/anesthesia complications.   Patient will remain NPO for procedure. Anesthesia and OR aware.  Preoperative prophylactic antibiotics and SCDs ordered on call to the OR.  To OR when ready. ? ?Dr. Roselie Awkward at bedside for discussion and agrees with plan.  ? ?Preeclampsia:  on magnesium sulfate, BP now mild range but previously needed IV labetalol for BP control.  ? ?Fetal Wellbeing:  Category II - see details above/ ?Pain Control:  Epidural ?I/D:   GBS pos, PCN will get preop Ancef and Azithromycin ? ?Renard Matter ?03/30/2021, 10:56 PM ? ? ?

## 2021-03-30 NOTE — Progress Notes (Signed)
Changed out telemetry monitors.  On [place].  Wired monitors adjusted and applied. ?

## 2021-03-30 NOTE — Anesthesia Procedure Notes (Signed)
Epidural ?Patient location during procedure: OB ?Start time: 03/30/2021 8:05 PM ?End time: 03/30/2021 8:17 PM ? ?Staffing ?Anesthesiologist: [name], MD ?Performed: anesthesiologist  ? ?Preanesthetic Checklist ?Completed: patient identified, IV checked, risks and benefits discussed, monitors and equipment checked, pre-op evaluation and timeout performed ? ?Epidural ?Patient position: sitting ?Prep: DuraPrep ?Patient monitoring: heart rate, continuous pulse ox and blood pressure ?Approach: midline ?Location: L3-L4 ?Injection technique: LOR air ? ?Needle:  ?Needle type: Tuohy  ?Needle gauge: 17 G ?Needle length: 9 cm ?Needle insertion depth: 7 cm ?Catheter type: closed end flexible ?Catheter size: 19 Gauge ?Catheter at skin depth: 12 cm ?Test dose: negative ? ?Assessment ?Events: blood not aspirated, injection not painful, no injection resistance, no paresthesia and negative IV test ? ?Additional Notes ?Reason for block:procedure for pain ? ? ? ?

## 2021-03-30 NOTE — Progress Notes (Signed)
Labor Progress Note ?Sabrina Dixon is a 21 y.o. G1P0 at [redacted]w[redacted]d who presented for IOL due to severe pre-eclampsia.  ? ?S: Resting in bed. Feeling more painful contractions now. Family at bedside. No concerns.  ? ?O:  ?BP 135/76   Pulse 68   Temp 98.6 ?F (37 ?C) (Oral)   Resp 15   Ht 5' (1.524 m)   Wt 89.4 kg   LMP 05/30/2020 (Approximate)   SpO2 99%   BMI 38.47 kg/m?  ? ?EFM: Baseline 115 bpm, min-mod variability, no accels, no decels  ?Toco: Every 3-4 minutes  ? ?CVE: Dilation: 3.5 ?Effacement (%): 70 ?Cervical Position: Anterior ?Station: -3 ?Presentation: Vertex ?Exam by:: C Jaimarie Rapozo ? ?A&P: 21 y.o. G1P0 [redacted]w[redacted]d  ? ?#Labor: SVE largely unchanged from prior exam. IUPC in place with adequate contraction pattern now after Pitocin break this morning. Contractions are becoming more painful. Will reassess in 4-6 hours after having adequate contractions. Discussed with Dr. Debroah Loop who is in agreement with plan of care.  ?#Pain: PRN, received IV Fentanyl, considering epidural  ?#FWB: Cat 2 due to intermittent periods of minimal variability. Continues to have periods with moderate variability in between. Will continue to monitor closely.  ?#GBS positive; PCN ? ?#Pre-eclampsia with severe features: On Magnesium. Treated with IV Labetalol earlier this afternoon with appropriate down trending in her BP. No other symptoms currently. Continues on her oral Labetalol 200 mg TID as well. CBC, CMP stable at noon. Mag level therapeutic at 6.2. Will continue to monitor closely. Plan for repeat labs q12 hours.  ? ?#GDM: CBGs remain borderline in the 140s. SSI added to regimen. Will continue to check q4hrs and treat as necessary.  ? ?Worthy Rancher, MD ?6:33 PM ? ?

## 2021-03-30 NOTE — Progress Notes (Signed)
Labor Progress Note ?Sabrina Dixon is a 21 y.o. G1P0 at [redacted]w[redacted]d who presented for IOL due to severe pre-E. ? ?S: Resting comfortably. No concerns.  ? ?O:  ?BP (!) 148/95   Pulse 77   Temp 98 ?F (36.7 ?C) (Oral)   Resp 15   Ht 5' (1.524 m)   Wt 89.4 kg   LMP 05/30/2020 (Approximate)   SpO2 99%   BMI 38.47 kg/m?  ? ?EFM: Baseline 120 bpm, min-mod variability, + accels, no decels  ?Toco: Every 2-3 minutes, inadequate MVUs ? ?CVE: Dilation: 3.5 ?Effacement (%): 70 ?Cervical Position: Anterior ?Station: -3 ?Presentation: Vertex ?Exam by:: Dr Annia Friendly ? ? ?A&P: 21 y.o. G1P0 [redacted]w[redacted]d  ? ?#Labor: Inadequate contraction pattern with MVUs. On Pitocin 30 milli-units/min. Unchanged SVE overnight. Will plan for 2 hour Pitocin break and plan to restart at 10 milli-units/min afterwards.  ?#Pain: PRN ?#FWB: Intermittently Cat 2 due to minimal variability. Suspect due to magnesium infusion. Continues to have intermittent accels and periods of moderate variability in between. Will continue to monitor.  ?#GBS positive; PCN ordered  ? ?#Pre-E with severe features: Normal to mild range pressures this morning. On Mag. 12 PM labs ordered, will follow up results. On Labetalol 200 mg TID. Will continue to monitor.  ? ?#GDM: CBGs 100s-134. Stable overall. Will continue to monitor with q4hr glucose checks.  ? ?Worthy Rancher, MD ?10:26 AM ? ?

## 2021-03-30 NOTE — Progress Notes (Signed)
Labor Progress Note ?Sabrina Dixon is a 21 y.o. G1P0 at [redacted]w[redacted]d presented for IOL due to pre-e with SF.  ? ?S: Feeling contractions when she is awake, but has been sleeping most of the night.  ? ?O:  ?BP (!) 163/94   Pulse 81   Temp 98 ?F (36.7 ?C) (Oral)   Resp 16   Ht 5' (1.524 m)   Wt 89.4 kg   LMP 05/30/2020 (Approximate)   SpO2 99%   BMI 38.47 kg/m?  ?EFM: 125/min/none/none ? ?CVE: Dilation: 3.5 ?Effacement (%): 70 ?Cervical Position: Anterior ?Station: -3 ?Presentation: Vertex ?Exam by:: Dr Annia Friendly ?Positive scalp stim with check  ? ? ?A&P: 21 y.o. G1P0 [redacted]w[redacted]d  ?#Labor: Some improvement in station and effacement, but dilation unchanged. Currently at 28 of pit and very comfortable in the room. After verbal consent, placed an IUPC. Will monitor contraction pattern and go up if needed. May consider a pit break if not changed in the next several hours.  ?#Pain: PRN ?#FWB: Cat I  ?#GBS positive ? ?#Pre-e with SF: Majority mild range, however required entire hypertensive protocol overnight. Received oral labetalol this am, now TID. No symptoms. Plan for repeat labs later today. Monitor closely.  ? ?#GDM: CBGS stable.  ? ? ?Allayne Stack, DO ?6:38 AM  ?

## 2021-03-30 NOTE — Anesthesia Preprocedure Evaluation (Addendum)
Anesthesia Evaluation  ?Patient identified by MRN, date of birth, ID band ?Patient awake ? ? ? ?Reviewed: ?Allergy & Precautions, H&P , NPO status , Patient's Chart, lab work & pertinent test results ? ?History of Anesthesia Complications ?Negative for: history of anesthetic complications ? ?Airway ?Mallampati: II ? ?TM Distance: >3 FB ? ? ? ? Dental ?  ?Pulmonary ?asthma ,  ?  ?Pulmonary exam normal ? ? ? ? ? ? ? Cardiovascular ?hypertension (severe preeclampsia),  ?Rhythm:regular Rate:Normal ? ? ?  ?Neuro/Psych ?negative neurological ROS ? negative psych ROS  ? GI/Hepatic ?negative GI ROS, Neg liver ROS,   ?Endo/Other  ?diabetes, Gestational ? Renal/GU ?negative Renal ROS  ?negative genitourinary ?  ?Musculoskeletal ? ? Abdominal ?  ?Peds ? Hematology ? ?(+) Blood dyscrasia, anemia ,   ?Anesthesia Other Findings ? ? Reproductive/Obstetrics ?(+) Pregnancy ? ?  ? ? ? ? ? ? ? ? ? ? ? ? ? ?  ?  ? ? ? ? ? ? ? ? ?Anesthesia Physical ?Anesthesia Plan ? ?ASA: 3 and emergent ? ?Anesthesia Plan: Epidural  ? ?Post-op Pain Management:   ? ?Induction:  ? ?PONV Risk Score and Plan:  ? ?Airway Management Planned:  ? ?Additional Equipment:  ? ?Intra-op Plan:  ? ?Post-operative Plan:  ? ?Informed Consent: I have reviewed the patients History and Physical, chart, labs and discussed the procedure including the risks, benefits and alternatives for the proposed anesthesia with the patient or authorized representative who has indicated his/her understanding and acceptance.  ? ? ? ? ? ?Plan Discussed with:  ? ?Anesthesia Plan Comments: (UPDATE 03/30/21 10:59 PM: Patient to go to OR for C section due to arrest of dilation. Labor epidural in place and functioning well. Will plan to use epidural for surgical anesthesia. Discussed with patient. )  ? ? ? ? ? ?Anesthesia Quick Evaluation ? ?

## 2021-03-31 ENCOUNTER — Encounter: Payer: Self-pay | Admitting: Obstetrics and Gynecology

## 2021-03-31 ENCOUNTER — Encounter (HOSPITAL_COMMUNITY): Payer: Self-pay | Admitting: Family Medicine

## 2021-03-31 DIAGNOSIS — O99820 Streptococcus B carrier state complicating pregnancy: Secondary | ICD-10-CM | POA: Insufficient documentation

## 2021-03-31 LAB — CBC
HCT: 25.7 % — ABNORMAL LOW (ref 36.0–46.0)
Hemoglobin: 8.2 g/dL — ABNORMAL LOW (ref 12.0–15.0)
MCH: 21.3 pg — ABNORMAL LOW (ref 26.0–34.0)
MCHC: 31.9 g/dL (ref 30.0–36.0)
MCV: 66.8 fL — ABNORMAL LOW (ref 80.0–100.0)
Platelets: 156 10*3/uL (ref 150–400)
RBC: 3.85 MIL/uL — ABNORMAL LOW (ref 3.87–5.11)
RDW: 15 % (ref 11.5–15.5)
WBC: 11.5 10*3/uL — ABNORMAL HIGH (ref 4.0–10.5)
nRBC: 0.2 % (ref 0.0–0.2)

## 2021-03-31 LAB — COMPREHENSIVE METABOLIC PANEL
ALT: 9 U/L (ref 0–44)
AST: 20 U/L (ref 15–41)
Albumin: 2 g/dL — ABNORMAL LOW (ref 3.5–5.0)
Alkaline Phosphatase: 196 U/L — ABNORMAL HIGH (ref 38–126)
Anion gap: 6 (ref 5–15)
BUN: <5 mg/dL — ABNORMAL LOW (ref 6–20)
CO2: 22 mmol/L (ref 22–32)
Calcium: 6.9 mg/dL — ABNORMAL LOW (ref 8.9–10.3)
Chloride: 105 mmol/L (ref 98–111)
Creatinine, Ser: 0.72 mg/dL (ref 0.44–1.00)
GFR, Estimated: >60 mL/min (ref >60)
Glucose, Bld: 91 mg/dL (ref 70–99)
Potassium: 3.7 mmol/L (ref 3.5–5.1)
Sodium: 133 mmol/L — ABNORMAL LOW (ref 135–145)
Total Bilirubin: 0.3 mg/dL (ref 0.3–1.2)
Total Protein: 4.8 g/dL — ABNORMAL LOW (ref 6.5–8.1)

## 2021-03-31 LAB — MAGNESIUM: Magnesium: 5.7 mg/dL — ABNORMAL HIGH (ref 1.7–2.4)

## 2021-03-31 LAB — CULTURE, BETA STREP (GROUP B ONLY): Strep Gp B Culture: POSITIVE — AB

## 2021-03-31 LAB — GLUCOSE, CAPILLARY: Glucose-Capillary: 103 mg/dL — ABNORMAL HIGH (ref 70–99)

## 2021-03-31 MED ORDER — SIMETHICONE 80 MG PO CHEW
80.0000 mg | CHEWABLE_TABLET | Freq: Three times a day (TID) | ORAL | Status: DC
  Administered 2021-03-31 – 2021-04-02 (×6): 80 mg via ORAL
  Filled 2021-03-31 (×7): qty 1

## 2021-03-31 MED ORDER — DIPHENHYDRAMINE HCL 25 MG PO CAPS
25.0000 mg | ORAL_CAPSULE | ORAL | Status: DC | PRN
  Administered 2021-04-01: 25 mg via ORAL
  Filled 2021-03-31: qty 1

## 2021-03-31 MED ORDER — IBUPROFEN 600 MG PO TABS
600.0000 mg | ORAL_TABLET | Freq: Four times a day (QID) | ORAL | Status: DC
  Administered 2021-03-31 – 2021-04-02 (×9): 600 mg via ORAL
  Filled 2021-03-31 (×10): qty 1

## 2021-03-31 MED ORDER — SCOPOLAMINE 1 MG/3DAYS TD PT72
1.0000 | MEDICATED_PATCH | Freq: Once | TRANSDERMAL | Status: DC
  Filled 2021-03-31: qty 1

## 2021-03-31 MED ORDER — COCONUT OIL OIL: 1.0000 "application " | TOPICAL_OIL | Status: DC | PRN

## 2021-03-31 MED ORDER — MORPHINE SULFATE (PF) 0.5 MG/ML IJ SOLN
INTRAMUSCULAR | Status: DC | PRN
  Administered 2021-03-31: 3 mg via EPIDURAL

## 2021-03-31 MED ORDER — KETOROLAC TROMETHAMINE 30 MG/ML IJ SOLN
INTRAMUSCULAR | Status: AC
  Filled 2021-03-31: qty 1

## 2021-03-31 MED ORDER — OXYCODONE HCL 5 MG/5ML PO SOLN: 5.0000 mg | Freq: Once | ORAL | Status: DC | PRN

## 2021-03-31 MED ORDER — NALOXONE HCL 4 MG/10ML IJ SOLN
1.0000 ug/kg/h | INTRAVENOUS | Status: DC | PRN
  Filled 2021-03-31: qty 5

## 2021-03-31 MED ORDER — SENNOSIDES-DOCUSATE SODIUM 8.6-50 MG PO TABS
2.0000 | ORAL_TABLET | Freq: Every day | ORAL | Status: DC
  Administered 2021-04-01 – 2021-04-02 (×2): 2 via ORAL
  Filled 2021-03-31 (×3): qty 2

## 2021-03-31 MED ORDER — MENTHOL 3 MG MT LOZG
1.0000 | LOZENGE | OROMUCOSAL | Status: DC | PRN
  Filled 2021-03-31: qty 9

## 2021-03-31 MED ORDER — ACETAMINOPHEN 500 MG PO TABS
1000.0000 mg | ORAL_TABLET | Freq: Four times a day (QID) | ORAL | Status: DC
  Administered 2021-03-31 – 2021-04-02 (×9): 1000 mg via ORAL
  Filled 2021-03-31 (×10): qty 2

## 2021-03-31 MED ORDER — OXYTOCIN-SODIUM CHLORIDE 30-0.9 UT/500ML-% IV SOLN: 2.5000 [IU]/h | INTRAVENOUS | Status: AC

## 2021-03-31 MED ORDER — PRENATAL MULTIVITAMIN CH
1.0000 | ORAL_TABLET | Freq: Every day | ORAL | Status: DC
  Administered 2021-03-31 – 2021-04-02 (×3): 1 via ORAL
  Filled 2021-03-31 (×4): qty 1

## 2021-03-31 MED ORDER — DIPHENHYDRAMINE HCL 50 MG/ML IJ SOLN: 12.5000 mg | INTRAMUSCULAR | Status: DC | PRN

## 2021-03-31 MED ORDER — KETOROLAC TROMETHAMINE 30 MG/ML IJ SOLN: 30.0000 mg | Freq: Four times a day (QID) | INTRAMUSCULAR | Status: AC | PRN

## 2021-03-31 MED ORDER — OXYCODONE HCL 5 MG PO TABS: 5.0000 mg | ORAL_TABLET | ORAL | Status: DC | PRN

## 2021-03-31 MED ORDER — DIPHENHYDRAMINE HCL 25 MG PO CAPS: 25.0000 mg | ORAL_CAPSULE | Freq: Four times a day (QID) | ORAL | Status: DC | PRN

## 2021-03-31 MED ORDER — FUROSEMIDE 40 MG PO TABS
20.0000 mg | ORAL_TABLET | Freq: Every day | ORAL | Status: DC
  Administered 2021-03-31: 20 mg via ORAL
  Filled 2021-03-31: qty 1

## 2021-03-31 MED ORDER — NALOXONE HCL 0.4 MG/ML IJ SOLN: 0.4000 mg | INTRAMUSCULAR | Status: DC | PRN

## 2021-03-31 MED ORDER — ONDANSETRON HCL 4 MG/2ML IJ SOLN: 4.0000 mg | Freq: Three times a day (TID) | INTRAMUSCULAR | Status: DC | PRN

## 2021-03-31 MED ORDER — PROCHLORPERAZINE EDISYLATE 10 MG/2ML IJ SOLN
10.0000 mg | Freq: Once | INTRAMUSCULAR | Status: DC | PRN
  Filled 2021-03-31: qty 2

## 2021-03-31 MED ORDER — WITCH HAZEL-GLYCERIN EX PADS: 1.0000 "application " | MEDICATED_PAD | CUTANEOUS | Status: DC | PRN

## 2021-03-31 MED ORDER — HYDROMORPHONE HCL 1 MG/ML IJ SOLN: 0.2500 mg | INTRAMUSCULAR | Status: DC | PRN

## 2021-03-31 MED ORDER — OXYTOCIN-SODIUM CHLORIDE 30-0.9 UT/500ML-% IV SOLN
INTRAVENOUS | Status: DC | PRN
  Administered 2021-03-30: 450 mL via INTRAVENOUS

## 2021-03-31 MED ORDER — NIFEDIPINE ER OSMOTIC RELEASE 30 MG PO TB24
30.0000 mg | ORAL_TABLET | Freq: Every day | ORAL | Status: DC
  Administered 2021-03-31: 30 mg via ORAL
  Filled 2021-03-31: qty 1

## 2021-03-31 MED ORDER — TETANUS-DIPHTH-ACELL PERTUSSIS 5-2.5-18.5 LF-MCG/0.5 IM SUSY: 0.5000 mL | PREFILLED_SYRINGE | Freq: Once | INTRAMUSCULAR | Status: DC

## 2021-03-31 MED ORDER — SODIUM CHLORIDE 0.9% FLUSH: 3.0000 mL | INTRAVENOUS | Status: DC | PRN

## 2021-03-31 MED ORDER — DIBUCAINE (PERIANAL) 1 % EX OINT
1.0000 "application " | TOPICAL_OINTMENT | CUTANEOUS | Status: DC | PRN
  Filled 2021-03-31: qty 28

## 2021-03-31 MED ORDER — SIMETHICONE 80 MG PO CHEW
80.0000 mg | CHEWABLE_TABLET | ORAL | Status: DC | PRN
  Filled 2021-03-31: qty 1

## 2021-03-31 MED ORDER — OXYCODONE HCL 5 MG PO TABS: 5.0000 mg | ORAL_TABLET | Freq: Once | ORAL | Status: DC | PRN

## 2021-03-31 MED ORDER — ZOLPIDEM TARTRATE 5 MG PO TABS: 5.0000 mg | ORAL_TABLET | Freq: Every evening | ORAL | Status: DC | PRN

## 2021-03-31 MED ORDER — MEPERIDINE HCL 25 MG/ML IJ SOLN: 6.2500 mg | INTRAMUSCULAR | Status: DC | PRN

## 2021-03-31 MED ORDER — TRANEXAMIC ACID-NACL 1000-0.7 MG/100ML-% IV SOLN
INTRAVENOUS | Status: AC
  Filled 2021-03-31: qty 100

## 2021-03-31 MED ORDER — ACETAMINOPHEN 10 MG/ML IV SOLN: 1000.0000 mg | Freq: Once | INTRAVENOUS | Status: DC | PRN

## 2021-03-31 MED ORDER — LACTATED RINGERS IV SOLN: INTRAVENOUS | Status: DC

## 2021-03-31 MED ORDER — ENOXAPARIN SODIUM 40 MG/0.4ML IJ SOSY
40.0000 mg | PREFILLED_SYRINGE | INTRAMUSCULAR | Status: DC
  Administered 2021-04-01 – 2021-04-02 (×2): 40 mg via SUBCUTANEOUS
  Filled 2021-03-31 (×2): qty 0.4

## 2021-03-31 MED ORDER — OXYTOCIN-SODIUM CHLORIDE 30-0.9 UT/500ML-% IV SOLN
INTRAVENOUS | Status: AC
  Filled 2021-03-31: qty 500

## 2021-03-31 NOTE — Progress Notes (Signed)
POSTPARTUM PROGRESS NOTE ? ?POD #1 ? ?Subjective: ? ?Sabrina Dixon is a 21 y.o. G1P1001 s/p pLTCS at [redacted]w[redacted]d. Today she notes feeling tired.  She notes episode of emesis overnight, she has not yet had anything to eat this am.  No nauea currently.  Foley in place, she has not yet been up to ambulation. She has not yet passed flatus, noBM.  Pain is well controlled.  Lochia minimal ?Denies fever/chills/chest pain/SOB.  no HA, no blurry vision, no RUQ pain ? ?Objective: ?Blood pressure (!) 142/77, pulse 62, temperature 97.8 ?F (36.6 ?C), temperature source Oral, resp. rate 18, height 5' (1.524 m), weight 89.4 kg, last menstrual period 05/30/2020, SpO2 95 %, unknown if currently breastfeeding. ? ?UOP: 1175/8hr ? ?Physical Exam:  ?General: alert, cooperative and no distress ?Chest: no respiratory distress, CTAB ?Heart: regular rate and rhythm ?Abdomen: soft, nontender, BS quiet ?Uterine Fundus: firm, non- tender ?Incision: pressure dressing in place- Clean and dry ?DVT Evaluation: No calf swelling or tenderness, SCDs in place ?Extremities: 1+ edema ?Skin: warm, dry ? ?Results for orders placed or performed during the hospital encounter of 03/28/21 (from the past 24 hour(s))  ?Comprehensive metabolic panel     Status: Abnormal  ? Collection Time: 03/30/21 11:59 AM  ?Result Value Ref Range  ? Sodium 132 (L) 135 - 145 mmol/L  ? Potassium 4.2 3.5 - 5.1 mmol/L  ? Chloride 105 98 - 111 mmol/L  ? CO2 19 (L) 22 - 32 mmol/L  ? Glucose, Bld 113 (H) 70 - 99 mg/dL  ? BUN <5 (L) 6 - 20 mg/dL  ? Creatinine, Ser 0.71 0.44 - 1.00 mg/dL  ? Calcium 7.8 (L) 8.9 - 10.3 mg/dL  ? Total Protein 6.4 (L) 6.5 - 8.1 g/dL  ? Albumin 2.5 (L) 3.5 - 5.0 g/dL  ? AST 20 15 - 41 U/L  ? ALT 10 0 - 44 U/L  ? Alkaline Phosphatase 259 (H) 38 - 126 U/L  ? Total Bilirubin 0.6 0.3 - 1.2 mg/dL  ? GFR, Estimated >60 >60 mL/min  ? Anion gap 8 5 - 15  ?CBC with Differential/Platelet     Status: Abnormal  ? Collection Time: 03/30/21 11:59 AM  ?Result Value Ref Range  ?  WBC 12.4 (H) 4.0 - 10.5 K/uL  ? RBC 4.66 3.87 - 5.11 MIL/uL  ? Hemoglobin 9.8 (L) 12.0 - 15.0 g/dL  ? HCT 31.0 (L) 36.0 - 46.0 %  ? MCV 66.5 (L) 80.0 - 100.0 fL  ? MCH 21.0 (L) 26.0 - 34.0 pg  ? MCHC 31.6 30.0 - 36.0 g/dL  ? RDW 14.9 11.5 - 15.5 %  ? Platelets 194 150 - 400 K/uL  ? nRBC 0.0 0.0 - 0.2 %  ? Neutrophils Relative % 88 %  ? Neutro Abs 10.9 (H) 1.7 - 7.7 K/uL  ? Lymphocytes Relative 7 %  ? Lymphs Abs 0.8 0.7 - 4.0 K/uL  ? Monocytes Relative 4 %  ? Monocytes Absolute 0.6 0.1 - 1.0 K/uL  ? Eosinophils Relative 0 %  ? Eosinophils Absolute 0.0 0.0 - 0.5 K/uL  ? Basophils Relative 0 %  ? Basophils Absolute 0.0 0.0 - 0.1 K/uL  ? Immature Granulocytes 1 %  ? Abs Immature Granulocytes 0.11 (H) 0.00 - 0.07 K/uL  ?Magnesium     Status: Abnormal  ? Collection Time: 03/30/21 11:59 AM  ?Result Value Ref Range  ? Magnesium 6.2 (HH) 1.7 - 2.4 mg/dL  ?Glucose, capillary     Status: Abnormal  ?  Collection Time: 03/30/21  2:11 PM  ?Result Value Ref Range  ? Glucose-Capillary 142 (H) 70 - 99 mg/dL  ?Glucose, capillary     Status: Abnormal  ? Collection Time: 03/30/21  6:06 PM  ?Result Value Ref Range  ? Glucose-Capillary 147 (H) 70 - 99 mg/dL  ?Glucose, capillary     Status: Abnormal  ? Collection Time: 03/30/21  8:36 PM  ?Result Value Ref Range  ? Glucose-Capillary 123 (H) 70 - 99 mg/dL  ?CBC     Status: Abnormal  ? Collection Time: 03/31/21  1:10 AM  ?Result Value Ref Range  ? WBC 11.5 (H) 4.0 - 10.5 K/uL  ? RBC 3.85 (L) 3.87 - 5.11 MIL/uL  ? Hemoglobin 8.2 (L) 12.0 - 15.0 g/dL  ? HCT 25.7 (L) 36.0 - 46.0 %  ? MCV 66.8 (L) 80.0 - 100.0 fL  ? MCH 21.3 (L) 26.0 - 34.0 pg  ? MCHC 31.9 30.0 - 36.0 g/dL  ? RDW 15.0 11.5 - 15.5 %  ? Platelets 156 150 - 400 K/uL  ? nRBC 0.2 0.0 - 0.2 %  ?Glucose, capillary     Status: Abnormal  ? Collection Time: 03/31/21  1:29 AM  ?Result Value Ref Range  ? Glucose-Capillary 103 (H) 70 - 99 mg/dL  ? ? ?Assessment/Plan: ?Sabrina Dixon is a 21 y.o. G1P1001 s/p pLTCS at [redacted]w[redacted]d POD#1 complicated  by: ?1) Preeclampsia with severe features ?-currently on Magnesium until later this evening ?-started on Procardia XL 30mg  daily and Lasix 20mg  daily ?-labs to be done @ 6pm tonight ? ?2) postpartum care ?-pain well controlled ?-plan to remove foley later today ?-encourage ambulation ? ?-Chlamydia- recently treated, []  TOC as outpatient ? ?Contraception: unsure ?Feeding: breast ? ?Dispo: Continue with postop care as outlined above ? ? LOS: 3 days  ? ?Janyth Pupa, DO ?Faculty Attending, [facility] [employer] [facility] ?03/31/2021, 11:29 AM  ?

## 2021-03-31 NOTE — Transfer of Care (Signed)
Immediate Anesthesia Transfer of Care Note ? ?Patient: Sabrina Dixon ? ?Procedure(s) Performed: CESAREAN SECTION ? ?Patient Location: PACU ? ?Anesthesia Type:Epidural ? ?Level of Consciousness: awake, alert  and patient cooperative ? ?Airway & Oxygen Therapy: Patient Spontanous Breathing ? ?Post-op Assessment: Report given to RN and Post -op Vital signs reviewed and stable ? ?Post vital signs: Reviewed and stable ? ?Last Vitals:  ?Vitals Value Taken Time  ?BP 117/63 03/31/21 0038  ?Temp    ?Pulse 68 03/31/21 0043  ?Resp 16 03/31/21 0043  ?SpO2 99 % 03/31/21 0043  ?Vitals shown include unvalidated device data. ? ?Last Pain:  ?Vitals:  ? 03/30/21 2200  ?TempSrc:   ?PainSc: Asleep  ?   ? ?  ? ?Complications: No notable events documented. ?

## 2021-03-31 NOTE — Anesthesia Postprocedure Evaluation (Signed)
Anesthesia Post Note ? ?Patient: Sabrina Dixon ? ?Procedure(s) Performed: CESAREAN SECTION ? ?  ? ?Patient location during evaluation: PACU ?Anesthesia Type: Epidural ?Level of consciousness: oriented and awake and alert ?Pain management: pain level controlled ?Vital Signs Assessment: post-procedure vital signs reviewed and stable ?Respiratory status: spontaneous breathing, respiratory function stable and nonlabored ventilation ?Cardiovascular status: blood pressure returned to baseline and stable ?Postop Assessment: no headache, no backache, no apparent nausea or vomiting and epidural receding ?Anesthetic complications: no ? ? ?No notable events documented. ? ?Last Vitals:  ?Vitals:  ? 03/31/21 0130 03/31/21 0135  ?BP:    ?Pulse: 60 64  ?Resp: 16 16  ?Temp:    ?SpO2: 100% 100%  ?  ?Last Pain:  ?Vitals:  ? 03/31/21 0130  ?TempSrc:   ?PainSc: 0-No pain  ? ?Pain Goal:   ? ?LLE Motor Response: Purposeful movement (03/31/21 0130) ?LLE Sensation: Tingling (03/31/21 0130) ?RLE Motor Response: Purposeful movement (03/31/21 0130) ?RLE Sensation: Tingling (03/31/21 0130) ?  ?  ?Epidural/Spinal Function Cutaneous sensation: Tingles (03/31/21 0130), Patient able to flex knees: Yes (03/31/21 0130), Patient able to lift hips off bed: Yes (03/31/21 0130), Back pain beyond tenderness at insertion site: No (03/31/21 0130), Progressively worsening motor and/or sensory loss: No (03/31/21 0130), Bowel and/or bladder incontinence post epidural: No (03/31/21 0130) ? ?[name] ? ? ? ? ?

## 2021-03-31 NOTE — Lactation Note (Signed)
This note was copied from a baby's chart. ?Lactation Consultation Note ?RN set mom up w/DEBP. ?Mom stated she is very tired and wants to rest right now. Would like Lactation to come back today. ? ?Patient Name: Sabrina Dixon ?Today's Date: 03/31/2021 ?  ?Age:20 hours ? ?Maternal Data ?  ? ?Feeding ?  ? ?LATCH Score ?Latch: Repeated attempts needed to sustain latch, nipple held in mouth throughout feeding, stimulation needed to elicit sucking reflex. ? ?Audible Swallowing: None ? ?Type of Nipple: Everted at rest and after stimulation ? ?Comfort (Breast/Nipple): Soft / non-tender ? ?Hold (Positioning): Full assist, staff holds infant at breast ? ?LATCH Score: 5 ? ? ?Lactation Tools Discussed/Used ?  ? ?Interventions ?  ? ?Discharge ?  ? ?Consult Status ?  ? ? ? ?Charyl Dancer ?03/31/2021, 3:06 AM ? ? ? ?

## 2021-03-31 NOTE — Lactation Note (Signed)
This note was copied from a baby's chart. ?Lactation Consultation Note ? ?Patient Name: Sabrina Dixon ?Today's Date: 03/31/2021 ?Reason for consult: Initial assessment;Mother's request;Difficult latch;Primapara;1st time breastfeeding;Early term 37-38.6wks;Breastfeeding assistance;Maternal endocrine disorder (PIH (mag)) ?Age:20 hours ? ?Mom feeding plan is breast and formula for now. Mom like to transition to offering more of breast milk. Infant not latching at the time of LC visit, doing tongue sucking not able to open wide enough to get depth on the breast.  ?LC fed 3 ml of EBM via spoon. Mom wanted to supplement with formula as opposed to pumping and offering her own milk. Mom bit of discomfort now. But wants to work on pumping and latching throughout the day.  ? ?Plan 1. To feed based on cues 8-12x 24hr period. Mom to offer breasts and look for signs of milk transfer ?2. Mom to supplement with EBM first followed by formula 5-7 ml per feeding with slow flow nipple and pace bottle feeding.  ?3. DEBP q 3hrs for 15 min  ?4. I and O sheet reviewed.  ?All questions answered at the end of the visit.  ? ?Mom to call WIC in am to get services started.  ? ?Maternal Data ?Has patient been taught Hand Expression?: Yes ? ?Feeding ?Mother's Current Feeding Choice: Breast Milk ?Nipple Type: Slow - flow ? ?LATCH Score ?  ? ?  ? ?  ? ?  ? ?  ? ?  ? ? ?Lactation Tools Discussed/Used ?Tools: Pump;Flanges ?Flange Size: 27 ?Breast pump type: Double-Electric Breast Pump ?Pump Education: Setup, frequency, and cleaning;Milk Storage ?Reason for Pumping: increase stimulation ?Pumping frequency: every 3 hrs for 15 min ? ?Interventions ?Interventions: Breast feeding basics reviewed;Assisted with latch;Skin to skin;Breast massage;Hand express;Breast compression;Adjust position;Support pillows;Position options;Expressed milk;DEBP;Education;Pace feeding;LC Psychologist, educational;Infant Driven Feeding Algorithm education ? ?Discharge ?Pump:  DEBP ?WIC Program: Yes ? ?Consult Status ?Consult Status: Follow-up ?Date: 04/01/21 ?Follow-up type: In-patient ? ? ? ?Cleatus Goodin  Nicholson-Springer ?03/31/2021, 1:36 PM ? ? ? ?

## 2021-03-31 NOTE — Discharge Summary (Signed)
? ?  Postpartum Discharge Summary ? ?Date of Service updated -04/02/21 ? ?   ?Patient Name: Sabrina Dixon ?DOB: Jul 31, 2000 ?MRN: [id number] ? ?Date of admission: 03/28/2021 ?Delivery date:03/30/2021  ?Delivering provider: Woodroe Mode  ?Date of discharge: 04/02/2021 ? ?Admitting diagnosis: Preeclampsia, severe, third trimester [O14.13] ?Intrauterine pregnancy: [redacted]w[redacted]d    ?Secondary diagnosis:  Principal Problem: ?  Preeclampsia, severe, third trimester ?Active Problems: ?  Supervision of high risk pregnancy, antepartum ?  Gestational diabetes mellitus (GDM) affecting pregnancy ?  Chlamydia ?  Cesarean delivery delivered ?  Acute on chronic blood loss anemia ? ?Additional problems: None    ?Discharge diagnosis: Term Pregnancy Delivered, Preeclampsia (severe), and GDM A1                                              ?Post partum procedures: none ?Augmentation: Pitocin, Cytotec, and IP Foley ?Complications: None ? ?Hospital course: Induction of Labor With Cesarean Section   ?21y.o. yo G1P1001 at 314w3das admitted to the hospital 03/28/2021 for induction of labor. Patient had a labor course significant for presented for IOL on 2/27 and received cytotec x5, had foley balloon placed. Once her foley balloon was out she was 3.5 cm and was eventually started on pitocin. She was on high doses of pitocin without cervical change and despite pitocin break and SROM for ~22 hours her cervix remained unchanged. Additionally, there were recurrent late decelerations on fetal monitor that did not resolve with position change and fluid bolus. Given she was remote from delivery and fetal intolerance of labor/failed IOL she was taken for a cesarean section. The patient went for cesarean section due to Non-Reassuring FHR in setting of failed IOL/remote from delivery. Delivery details are as follows: ?Membrane Rupture Time/Date: 12:14 AM ,03/30/2021   ?Delivery Method:C-Section, Vacuum Assisted  ?Details of operation can be found in separate  operative Note.  Due to preeclampsia, she received IV Magnesium x 24hours.  Blood pressure was managed with Lasix and procardia and adjusted as needed.  Pt sent home with Procardia XL 606maily and Lasix x 5 days.  She is ambulating, tolerating a regular diet, passing flatus, and urinating well.  Patient is discharged home in stable condition on 04/02/21 with plans for outpatient follow up. ? ?Newborn Data: ?Birth date:03/30/2021  ?Birth time:11:41 PM  ?Gender:Female  ?Living status:Living  ?Apgars:2 ,9  ?Weight:2750 g                               ? ?Magnesium Sulfate received: Yes: Seizure prophylaxis ?BMZ received: No ?Rhophylac:N/A ?MMR:N/A ?T-DaP: declined ?Flu: declined ?Transfusion:No ? ?Physical exam  ?Vitals:  ? 04/01/21 1159 04/01/21 1642 04/02/21 0337 04/02/21 0810  ?BP: (!) 151/77 (!) 149/65 (!) 148/79 (!) 145/87  ?Pulse: 70 87 90 84  ?Resp: 16 15 16 17   ?Temp: 99.8 ?F (37.7 ?C) 98 ?F (36.7 ?C) 98.3 ?F (36.8 ?C) (!) 97.5 ?F (36.4 ?C)  ?TempSrc: Oral Oral Oral Oral  ?SpO2: 99% 99% 99% 100%  ?Weight:      ?Height:      ? ?General: alert, cooperative, and no distress ?CV: RRR ?Lungs: CTAB ?Abd: +BS, non-tender, no rebound, no guarding ?Lochia: appropriate ?Uterine Fundus: firm ?Incision: Dressing is clean, dry, and intact ?DVT Evaluation: No evidence of DVT seen on physical exam. ?  Labs: ?Lab Results  ?Component Value Date  ? WBC 11.6 (H) 04/01/2021  ? HGB 7.4 (L) 04/01/2021  ? HCT 23.8 (L) 04/01/2021  ? MCV 66.3 (L) 04/01/2021  ? PLT 163 04/01/2021  ? ?CMP Latest Ref Rng & Units 03/31/2021  ?Glucose 70 - 99 mg/dL 91  ?BUN 6 - 20 mg/dL <5(L)  ?Creatinine 0.44 - 1.00 mg/dL 0.72  ?Sodium 135 - 145 mmol/L 133(L)  ?Potassium 3.5 - 5.1 mmol/L 3.7  ?Chloride 98 - 111 mmol/L 105  ?CO2 22 - 32 mmol/L 22  ?Calcium 8.9 - 10.3 mg/dL 6.9(L)  ?Total Protein 6.5 - 8.1 g/dL 4.8(L)  ?Total Bilirubin 0.3 - 1.2 mg/dL 0.3  ?Alkaline Phos 38 - 126 U/L 196(H)  ?AST 15 - 41 U/L 20  ?ALT 0 - 44 U/L 9  ? ?Edinburgh Score: ?Edinburgh  Postnatal Depression Scale Screening Tool 04/01/2021  ?I have been able to laugh and see the funny side of things. 0  ?I have looked forward with enjoyment to things. 0  ?I have blamed myself unnecessarily when things went wrong. 1  ?I have been anxious or worried for no good reason. 0  ?I have felt scared or panicky for no good reason. 0  ?Things have been getting on top of me. 0  ?I have been so unhappy that I have had difficulty sleeping. 0  ?I have felt sad or miserable. 0  ?I have been so unhappy that I have been crying. 0  ?The thought of harming myself has occurred to me. 0  ?Edinburgh Postnatal Depression Scale Total 1  ? ? ? ?After visit meds:  ?Allergies as of 04/02/2021   ?No Known Allergies ?  ? ?  ?Medication List  ?  ? ?STOP taking these medications   ? ?Accu-Chek Guide test strip ?Generic drug: glucose blood ?  ?Accu-Chek Softclix Lancets lancets ?  ? ?  ? ?TAKE these medications   ? ?acetaminophen 325 MG tablet ?Commonly known as: Tylenol ?Take 2 tablets (650 mg total) by mouth every 6 (six) hours as needed. ?  ?furosemide 20 MG tablet ?Commonly known as: LASIX ?Take 1 tablet (20 mg total) by mouth 2 (two) times daily for 3 days. ?  ?ibuprofen 600 MG tablet ?Commonly known as: ADVIL ?Take 1 tablet (600 mg total) by mouth every 6 (six) hours as needed. ?  ?multivitamin-prenatal 27-0.8 MG Tabs tablet ?Take 1 tablet by mouth daily at 12 noon. ?  ?NIFEdipine 60 MG 24 hr tablet ?Commonly known as: ADALAT CC ?Take 1 tablet (60 mg total) by mouth daily. ?  ? ?  ? ? ? ?Discharge home in stable condition ?Infant Feeding: Breast ?Infant Disposition:home with mother ?Discharge instruction: per After Visit Summary and Postpartum booklet. ?Activity: Advance as tolerated. Pelvic rest for 6 weeks.  ?Diet: routine diet ?Future Appointments: ?Future Appointments  ?Date Time Provider [place]  ?04/07/2021  2:00 PM [facility] NURSE [facility] [place]  ?04/28/2021  8:20 AM [facility] LAB [facility]  ?04/28/2021  9:35 AM Donnamae Jude, MD [facility]  ? ?Follow up Visit: ?Message sent to [facility] by Dr. Cy Blamer on 03/31/2021 ? ?Please schedule this patient for a In person postpartum visit in 4 weeks with the following provider: MD. ?Additional Postpartum F/U:2 hour GTT, Incision check 1 week, and BP check 1 week  ?High risk pregnancy complicated by:  preE with SF and GDM ?Delivery mode:  C-Section, Vacuum Assisted  ?Anticipated Birth Control:  Condoms ? ? ?04/02/2021 ?Clearnce Sorrel  Daray Polgar, DO ? ? ? ?

## 2021-03-31 NOTE — Op Note (Signed)
Operative Note  ? ?Patient: Sabrina Dixon ? ?Date of Procedure: 03/30/2021 - 03/31/2021 ? ?Procedure: Primary Low Transverse Cesarean  ? ?Indications:  failed IOL, fetal intolerance of labor while remote from delivery. ? ?Pre-operative Diagnosis: Primary cesarean section; failed IOL, fetal intolerance to labor remote from delivery, pre-eclampsia with severe features ? ?Post-operative Diagnosis: Same ? ?TOLAC Candidate: Yes  ? ?Surgeon: Juliann Mule) and Role: ?   * Woodroe Mode, MD - Primary ?   Renard Matter, MD - Assisting ? ? ?An experienced assistant was required given the standard of surgical care given the complexity of the case.  This assistant was needed for exposure, dissection, suctioning, retraction, instrument exchange, assisting with delivery with administration of fundal pressure, and for overall help during the procedure.  ? ?Anesthesia: epidural ? ?Anesthesiologist: Lidia Collum, MD  ? ?Antibiotics: Cefazolin and Azithromycin ?  ?Estimated Blood Loss: 361 ml  ? ?Total IV Fluids: 1200 ml ? ?Urine Output:  100 cc OF clear urine ? ?Specimens: none  ? ?Complications: no complications  ? ?Indications: ?Sabrina Dixon is a 21 y.o. G1P1001 with an IUP [redacted]w[redacted]d presenting for unscheduled, urgent cesarean secondary to the indications listed above. Clinical course notable for patient presented for IOL on 2/27 and received cytotec x5, had foley balloon placed. Once her foley balloon was out she was 3.5 cm and was eventually started on pitocin. She was on high doses of pitocin without cervical change and despite pitocin break and SROM for ~22 hours her cervix remained unchanged. Additionally, there were recurrent late decelerations on fetal monitor that did not resolve with position change and fluid bolus. Given she was remote from delivery and fetal intolerance of labor/failed IOL she was taken for a cesarean section. ? ?Findings: Viable infant in cephalic presentation, no nuchal cord present. Apgars 2 , 9 , . Weight  pending . Clear amniotic fluid. Normal placenta, three vessel cord. Normal uterus, Normal bilateral fallopian tubes, Normal bilateral ovaries. ? ?Procedure Details: A Time Out was held and the above information confirmed. The patient received intravenous antibiotics and had sequential compression devices applied to her lower extremities preoperatively. The patient was taken back to the operative suite where epidural anesthesia was administered. After induction of anesthesia, the patient was draped and prepped in the usual sterile manner and placed in a dorsal supine position with a leftward tilt. A low transverse skin incision was made with scalpel and carried down through the subcutaneous tissue to the fascia. Fascial incision was made and extended transversely. The fascia was separated from the underlying rectus tissue superiorly and inferiorly. The rectus muscles were separated in the midline bluntly and the peritoneum was entered bluntly. An Alexis retractor was placed to aid in visualization of the uterus. Due to tight fit of the fascia as well as muscle were dissected in order to make more room. A bladder flap was not developed. A low transverse uterine incision was made. The infant was attempted to be delivered however due to tight fit and outlet once the occiput was at hysterotomy a kiwi vacuum was utilized to successfully deliver infant from cephalic presentation, the umbilical cord was clamped immediately. Cord ph was attempted to be collected however there was not enough cord blood to collect some., and cord blood was obtained for evaluation. The placenta was removed Intact and appeared normal. The uterine incision was closed with running locked sutures of 0-Vicryl, and then a second imbricating layer was also placed with 0-Vicryl. Overall, excellent hemostasis was noted.  The abdomen and the pelvis were cleared of all clot and debris and the Ubaldo Glassing was removed. Hemostasis was confirmed on all surfaces.   The peritoneum was reapproximated using 2-0 vicryl . The fascia was then closed using 0 Vicryl in a running fashion. The subcutaneous layer was reapproximated with plain gut and the skin was closed with a 4-0 vicryl subcuticular stitch. The patient tolerated the procedure well. Sponge, lap, instrument and needle counts were correct x 2. She was taken to the recovery room in stable condition. ? ?Disposition: PACU - hemodynamically stable.  ? ? ?Signed: ?Renard Matter, MD, MPH ?[facility] [employer] farm manager) ? ?

## 2021-04-01 ENCOUNTER — Other Ambulatory Visit (HOSPITAL_COMMUNITY): Payer: Self-pay

## 2021-04-01 ENCOUNTER — Ambulatory Visit: Payer: Medicaid Other

## 2021-04-01 DIAGNOSIS — D62 Acute posthemorrhagic anemia: Secondary | ICD-10-CM | POA: Diagnosis present

## 2021-04-01 LAB — CBC
HCT: 23.8 % — ABNORMAL LOW (ref 36.0–46.0)
Hemoglobin: 7.4 g/dL — ABNORMAL LOW (ref 12.0–15.0)
MCH: 20.6 pg — ABNORMAL LOW (ref 26.0–34.0)
MCHC: 31.1 g/dL (ref 30.0–36.0)
MCV: 66.3 fL — ABNORMAL LOW (ref 80.0–100.0)
Platelets: 163 10*3/uL (ref 150–400)
RBC: 3.59 MIL/uL — ABNORMAL LOW (ref 3.87–5.11)
RDW: 15 % (ref 11.5–15.5)
WBC: 11.6 10*3/uL — ABNORMAL HIGH (ref 4.0–10.5)
nRBC: 0 % (ref 0.0–0.2)

## 2021-04-01 LAB — SURGICAL PATHOLOGY

## 2021-04-01 LAB — GLUCOSE, CAPILLARY: Glucose-Capillary: 95 mg/dL (ref 70–99)

## 2021-04-01 MED ORDER — FUROSEMIDE 20 MG PO TABS
20.0000 mg | ORAL_TABLET | Freq: Two times a day (BID) | ORAL | 0 refills | Status: DC
  Filled 2021-04-01: qty 6, 3d supply, fill #0

## 2021-04-01 MED ORDER — NIFEDIPINE ER OSMOTIC RELEASE 60 MG PO TB24
60.0000 mg | ORAL_TABLET | Freq: Every day | ORAL | Status: DC
  Administered 2021-04-01 – 2021-04-02 (×2): 60 mg via ORAL
  Filled 2021-04-01 (×2): qty 1

## 2021-04-01 MED ORDER — SODIUM CHLORIDE 0.9 % IV SOLN
500.0000 mg | Freq: Once | INTRAVENOUS | Status: AC
  Administered 2021-04-01: 500 mg via INTRAVENOUS
  Filled 2021-04-01: qty 25

## 2021-04-01 MED ORDER — FUROSEMIDE 40 MG PO TABS
20.0000 mg | ORAL_TABLET | Freq: Two times a day (BID) | ORAL | Status: DC
  Administered 2021-04-01 – 2021-04-02 (×3): 20 mg via ORAL
  Filled 2021-04-01 (×3): qty 1

## 2021-04-01 MED ORDER — NIFEDIPINE ER 60 MG PO TB24
60.0000 mg | ORAL_TABLET | Freq: Every day | ORAL | 0 refills | Status: DC
Start: 2021-04-02 — End: 2021-04-27
  Filled 2021-04-01: qty 30, 30d supply, fill #0

## 2021-04-01 NOTE — Progress Notes (Addendum)
POSTPARTUM PROGRESS NOTE ? ?POD #2 ? ?Subjective: ? ?Sabrina Dixon Below is a 21 y.o. G1P1001 s/p pLTCS at [redacted]w[redacted]d.  Pain is well controlled.  Lochia minimal. Baby is stable at bedside.  Ambulating, no lightheadedness or syncopal symptoms. ?Denies fever/chills/chest pain/SOB.  No HA, no blurry vision, no RUQ pain ? ?Objective: ?Blood pressure (!) 141/72, pulse 84, temperature 98.3 ?F (36.8 ?C), temperature source Oral, resp. rate 18, height 5' (1.524 m), weight 89.4 kg, last menstrual period 05/30/2020, SpO2 99 %, unknown if currently breastfeeding. ?Patient Vitals for the past 24 hrs: ? BP Temp Temp src Pulse Resp SpO2  ?04/01/21 0417 (!) 141/72 98.3 ?F (36.8 ?C) Oral 84 18 99 %  ?03/31/21 2359 (!) 141/78 98.5 ?F (36.9 ?C) Oral 83 18 99 %  ?03/31/21 2300 -- -- -- -- 18 --  ?03/31/21 2200 -- -- -- -- 17 --  ?03/31/21 2100 -- -- -- -- 18 --  ?03/31/21 2004 (!) 149/76 98.3 ?F (36.8 ?C) Oral 62 18 --  ?03/31/21 1654 (!) 150/83 -- -- (!) 55 -- --  ?03/31/21 1630 (!) 162/88 97.8 ?F (36.6 ?C) Oral 64 18 98 %  ?03/31/21 1129 128/66 97.7 ?F (36.5 ?C) Oral (!) 56 18 98 %  ?03/31/21 0753 (!) 142/77 97.8 ?F (36.6 ?C) Oral 62 18 95 %  ? ?Intake/Output Summary (Last 24 hours) at 04/01/2021 0723 ?Last data filed at 04/01/2021 0600 ?Gross per 24 hour  ?Intake 4497.21 ml  ?Output 6350 ml  ?Net -1852.79 ml  ? ? ?Physical Exam:  ?General: alert, cooperative and no distress ?Chest: no respiratory distress, CTAB ?Heart: regular rate and rhythm ?Abdomen: soft, nontender, BS quiet ?Uterine Fundus: firm, non- tender ?Incision: pressure dressing in place- Clean and dry ?DVT Evaluation: No calf swelling or tenderness, SCDs in place ?Extremities: 1+ edema ?Skin: warm, dry ? ?Results for orders placed or performed during the hospital encounter of 03/28/21 (from the past 24 hour(s))  ?Magnesium     Status: Abnormal  ? Collection Time: 03/31/21  6:02 PM  ?Result Value Ref Range  ? Magnesium 5.7 (H) 1.7 - 2.4 mg/dL  ?Comprehensive metabolic panel     Status:  Abnormal  ? Collection Time: 03/31/21  6:02 PM  ?Result Value Ref Range  ? Sodium 133 (L) 135 - 145 mmol/L  ? Potassium 3.7 3.5 - 5.1 mmol/L  ? Chloride 105 98 - 111 mmol/L  ? CO2 22 22 - 32 mmol/L  ? Glucose, Bld 91 70 - 99 mg/dL  ? BUN <5 (L) 6 - 20 mg/dL  ? Creatinine, Ser 0.72 0.44 - 1.00 mg/dL  ? Calcium 6.9 (L) 8.9 - 10.3 mg/dL  ? Total Protein 4.8 (L) 6.5 - 8.1 g/dL  ? Albumin 2.0 (L) 3.5 - 5.0 g/dL  ? AST 20 15 - 41 U/L  ? ALT 9 0 - 44 U/L  ? Alkaline Phosphatase 196 (H) 38 - 126 U/L  ? Total Bilirubin 0.3 0.3 - 1.2 mg/dL  ? GFR, Estimated >60 >60 mL/min  ? Anion gap 6 5 - 15  ?CBC     Status: Abnormal  ? Collection Time: 04/01/21  5:20 AM  ?Result Value Ref Range  ? WBC 11.6 (H) 4.0 - 10.5 K/uL  ? RBC 3.59 (L) 3.87 - 5.11 MIL/uL  ? Hemoglobin 7.4 (L) 12.0 - 15.0 g/dL  ? HCT 23.8 (L) 36.0 - 46.0 %  ? MCV 66.3 (L) 80.0 - 100.0 fL  ? MCH 20.6 (L) 26.0 - 34.0 pg  ?  MCHC 31.1 30.0 - 36.0 g/dL  ? RDW 15.0 11.5 - 15.5 %  ? Platelets 163 150 - 400 K/uL  ? nRBC 0.0 0.0 - 0.2 %  ? ? ?Assessment/Plan: ?Sabrina Dixon is a 21 y.o. G1P1001 s/p pLTCS at [redacted]w[redacted]d POD#2 complicated by: ?1) Preeclampsia with severe features ?-currently on Magnesium until later this evening ?-started on Procardia XL 30mg  daily and Lasix 20mg  daily ?-labs to be done @ 6pm tonight ? ?2) Acute blood loss anemia on chronic anemia ?- Asymptomatic. Counseled about Venofer she agreed, this will be given. ? ?3) GDM ?- Stable CBGs ? ?4) Postpartum care ?-pain well controlled ?-encourage ambulation ?-Chlamydia- recently treated, will do TOC as outpatient ? ?Contraception: unsure ?Feeding: breast ? ?Dispo: Continue with postop care as outlined above ? ? LOS: 4 days  ? ? ? ? , MD, FACOG ?Obstetrician , Faculty Practice ?Center for [facility], [facility] ? ?

## 2021-04-01 NOTE — Plan of Care (Signed)

## 2021-04-01 NOTE — Lactation Note (Signed)
This note was copied from a baby's chart. ?Lactation Consultation Note ? ?Patient Name: Sabrina Dixon ?Today's Date: 04/01/2021 ?Reason for consult: Follow-up assessment;Primapara;1st time breastfeeding;Early term 37-38.6wks;Other (Comment) (Baby recently fed formula and having a hearing screen. per mom the baby has not latchred today, but has latched . LC encouraged mom to call for latch assist if desires. per mom may just formula feed.) ?Age:20 hours ?LC offered to review the DEBP and mom declined LC's offer.  ?Maternal Data ?  ? ?Feeding ?Mother's Current Feeding Choice: Breast Milk and Formula ?Nipple Type: Slow - flow ? ?LATCH Score ?  ? ?  ? ?  ? ?  ? ?  ? ?  ? ? ?Lactation Tools Discussed/Used ?Tools: Pump;Flanges (LC offered to review the DEBP and mom declined to pump and pe rmom has not pumped since yesterday.) ?Flange Size: 27 ?Breast pump type: Double-Electric Breast Pump ? ?Interventions ?  ? ?Discharge ?  ? ?Consult Status ?Consult Status: Follow-up ?Date: 04/02/21 ?Follow-up type: In-patient ? ? ? ?Jerlyn Ly Shemeika Starzyk ?04/01/2021, 6:29 PM ? ? ? ?

## 2021-04-01 NOTE — Clinical Social Work Maternal (Signed)
?CLINICAL SOCIAL WORK MATERNAL/CHILD NOTE ? ?Patient Details  ?Name: Sabrina Dixon ?MRN: [id number] ?Date of Birth: [birth year] ? ?Date:  04/01/2021 ? ?Clinical Social Worker Initiating Note:  Abundio Miu, [place] Date/Time: Initiated:  04/01/21/1318    ? ?Child's Name:  Sabrina Dixon  ? ?Biological Parents:  Mother, Father (Father: Lynelle Doctor)  ? ?Need for Interpreter:  None  ? ?Reason for Referral:  Current Substance Use/Substance Use During Pregnancy    ? ?Address:  [address] ?[place] [address]  ?  ?Phone number:  [phone] (home)    ? ?Additional phone number:  ? ?Household Members/Support Persons (HM/SP):   Household Member/Support Person 1, Household Member/Support Person 2, Household Member/Support Person 3 ? ? ?HM/SP Name Relationship DOB or Age  ?HM/SP -1 Thaileel Johnson FOB 03/31/1998  ?HM/SP -2   FOB's mom    ?HM/SP -3 FOB's mom's husband      ?HM/SP -4        ?HM/SP -5        ?HM/SP -6        ?HM/SP -7        ?HM/SP -8        ? ? ?Natural Supports (not living in the home):  Immediate Family, Parent  ? ?Professional Supports: None  ? ?Employment: Unemployed  ? ?Type of Work:    ? ?Education:  High school graduate  ? ?Homebound arranged:   ? ?Financial Resources:  Medicaid  ? ?Other Resources:  WIC  ? ?Cultural/Religious Considerations Which May Impact Care:   ? ?Strengths:  Ability to meet basic needs  , Pediatrician chosen, Home prepared for child    ? ?Psychotropic Medications:        ? ?Pediatrician:    [place] area ? ?Pediatrician List:  ? ?[facility]  ?[place]    ?[facility]    ?[facility]    ?[facility]    ?[facility]    ? ? ?Pediatrician Fax Number:   ? ?Risk Factors/Current Problems:  Substance Use    ? ?Cognitive State:  Able to Concentrate  , Alert  , Goal Oriented  , Linear Thinking    ? ?Mood/Affect:  Calm  , Interested  , Comfortable  , Happy    ? ?CSW Assessment: CSW met with MOB at bedside to complete  psychosocial assessment, FOB present. CSW introduced self and asked FOB to leave the room to speak with MOB privately, FOB left the room. CSW explained reason for consult. MOB was welcoming, pleasant, and remained engaged during assessment. MOB reported that she resides with FOB, FOB's mom, and FOB's mom's husband. MOB reported that she receives [facility] and they have all items needed to care for infant including a car seat and crib. CSW inquired about MOB's support system, MOB reported that FOB, her mom, FOB's mom and her aunt are supports.  ? ?CSW inquired about MOB's mental health history. MOB denied any mental health history. CSW inquired about how MOB was feeling emotionally since giving birth, MOB reported that she was feeling great and loves being a mom. MOB presented calm and did not demonstrate any acute mental health signs/symptoms. CSW assessed for safety, MOB denied SI, HI, and domestic violence.  ? ?CSW provided education regarding the baby blues period vs. perinatal mood disorders, discussed treatment and gave resources for mental health follow up if concerns arise.  CSW recommends self-evaluation during the postpartum time period using the New Mom Checklist from Postpartum  Progress and encouraged MOB to contact a medical professional if symptoms are noted at any time.   ? ?CSW provided review of Sudden Infant Death Syndrome (SIDS) precautions.   ? ?CSW informed MOB about the hospital drug screen policy due to documented substance use during pregnancy. MOB confirmed marijuana use and reported last use as a little bit after finding out about pregnancy. MOB denied any additional substance use during pregnancy. CSW informed MOB that infant's UDS was negative and CDS would continue to be monitored and a CPS report would be made if warranted. MOB verbalized understanding and asked appropriate questions. CSW answered questions. CSW asked if MOB was interested in community supports to assist with parenting  education, MOB declined.  ? ?CSW identifies no further need for intervention and no barriers to discharge at this time. ? ?CSW Plan/Description:  No Further Intervention Required/No Barriers to Discharge, Sudden Infant Death Syndrome (SIDS) Education, Perinatal Mood and Anxiety Disorder (PMADs) Education, [place], CSW Will Continue to Monitor Umbilical Cord Tissue Drug Screen Results and Make Report if Warranted  ? ? ?Burnis Medin, LCSW ?04/01/2021, 1:20 PM ? ?

## 2021-04-02 ENCOUNTER — Ambulatory Visit: Payer: Self-pay

## 2021-04-02 MED ORDER — IBUPROFEN 600 MG PO TABS: 600.0000 mg | ORAL_TABLET | Freq: Four times a day (QID) | ORAL | 0 refills | Status: DC | PRN

## 2021-04-02 MED ORDER — ACETAMINOPHEN 325 MG PO TABS: 650.0000 mg | ORAL_TABLET | Freq: Four times a day (QID) | ORAL | Status: DC | PRN

## 2021-04-02 NOTE — Progress Notes (Signed)
Discharge information given to patient. Follow-up care, medications, and when to call the MD discussed and patient verbalized understanding. All questions and concerns addressed. Patient is alert and oriented x4, ambulatory and states no pain. Provider aware of VS. Patient will wait in room for pediatrician.  ?

## 2021-04-02 NOTE — Plan of Care (Signed)
?  Problem: Education: ?Goal: Knowledge of General Education information will improve ?Description: Including pain rating scale, medication(s)/side effects and non-pharmacologic comfort measures ?Outcome: Adequate for Discharge ?  ?Problem: Health Behavior/Discharge Planning: ?Goal: Ability to manage health-related needs will improve ?Outcome: Adequate for Discharge ?  ?Problem: Clinical Measurements: ?Goal: Ability to maintain clinical measurements within normal limits will improve ?Outcome: Adequate for Discharge ?Goal: Will remain free from infection ?Outcome: Adequate for Discharge ?Goal: Diagnostic test results will improve ?Outcome: Adequate for Discharge ?Goal: Respiratory complications will improve ?Outcome: Adequate for Discharge ?Goal: Cardiovascular complication will be avoided ?Outcome: Adequate for Discharge ?  ?Problem: Activity: ?Goal: Risk for activity intolerance will decrease ?Outcome: Adequate for Discharge ?  ?Problem: Nutrition: ?Goal: Adequate nutrition will be maintained ?Outcome: Adequate for Discharge ?  ?Problem: Coping: ?Goal: Level of anxiety will decrease ?Outcome: Adequate for Discharge ?  ?Problem: Elimination: ?Goal: Will not experience complications related to bowel motility ?Outcome: Adequate for Discharge ?Goal: Will not experience complications related to urinary retention ?Outcome: Adequate for Discharge ?  ?Problem: Pain Managment: ?Goal: General experience of comfort will improve ?Outcome: Adequate for Discharge ?  ?Problem: Safety: ?Goal: Ability to remain free from injury will improve ?Outcome: Adequate for Discharge ?  ?Problem: Skin Integrity: ?Goal: Risk for impaired skin integrity will decrease ?Outcome: Adequate for Discharge ?  ?Problem: Education: ?Goal: Knowledge of Childbirth will improve ?Outcome: Adequate for Discharge ?Goal: Ability to make informed decisions regarding treatment and plan of care will improve ?Outcome: Adequate for Discharge ?Goal: Ability to state  and carry out methods to decrease the pain will improve ?Outcome: Adequate for Discharge ?Goal: Individualized Educational Video(s) ?Outcome: Adequate for Discharge ?  ?Problem: Coping: ?Goal: Ability to verbalize concerns and feelings about labor and delivery will improve ?Outcome: Adequate for Discharge ?  ?Problem: Life Cycle: ?Goal: Ability to make normal progression through stages of labor will improve ?Outcome: Adequate for Discharge ?Goal: Ability to effectively push during vaginal delivery will improve ?Outcome: Adequate for Discharge ?  ?Problem: Role Relationship: ?Goal: Will demonstrate positive interactions with the child ?Outcome: Adequate for Discharge ?  ?Problem: Safety: ?Goal: Risk of complications during labor and delivery will decrease ?Outcome: Adequate for Discharge ?  ?Problem: Pain Management: ?Goal: Relief or control of pain from uterine contractions will improve ?Outcome: Adequate for Discharge ?  ?Problem: Education: ?Goal: Knowledge of condition will improve ?Outcome: Adequate for Discharge ?Goal: Individualized Educational Video(s) ?Outcome: Adequate for Discharge ?Goal: Individualized Newborn Educational Video(s) ?Outcome: Adequate for Discharge ?  ?Problem: Activity: ?Goal: Will verbalize the importance of balancing activity with adequate rest periods ?Outcome: Adequate for Discharge ?Goal: Ability to tolerate increased activity will improve ?Outcome: Adequate for Discharge ?  ?Problem: Coping: ?Goal: Ability to identify and utilize available resources and services will improve ?Outcome: Adequate for Discharge ?  ?Problem: Life Cycle: ?Goal: Chance of risk for complications during the postpartum period will decrease ?Outcome: Adequate for Discharge ?  ?Problem: Role Relationship: ?Goal: Ability to demonstrate positive interaction with newborn will improve ?Outcome: Adequate for Discharge ?  ?Problem: Skin Integrity: ?Goal: Demonstration of wound healing without infection will  improve ?Outcome: Adequate for Discharge ?  ?

## 2021-04-02 NOTE — Lactation Note (Signed)
This note was copied from a baby's chart. ?Lactation Consultation Note ? ?Patient Name: Sabrina Dixon ?Today's Date: 04/02/2021 ?Reason for consult: Early term 56-38.6wks;Follow-up assessment ?Age:20 hours ? ?LC in to room for follow up. Mother has been discharged and seems very eager to have baby discharged, too. Mother prefers meeting at patient lounge. She states baby goes to breast well but she is supplementing formula feeding. Mother reports no breast milk when pumping and she is concerned about supply. Mother's goal is to exclusively breastfeed.  ?Mother explains she is pumping every 6-h. Encouraged to pump every 3h. Review volume guidelines for formula feeding. ?Discussed normal behavior and patterns, voids and stools as signs good intake, pumping, clusterfeeding, skin to skin. Talked about managing engorgement.  ? ?Plan: ?1-Feeding on demand or 8-12 times in 24h period. ?2-Hand express/pump as needed for supplementation ?3-Encouraged maternal rest, hydration and food intake.  ? ?Contact LC as needed for feeds/support/concerns/questions. All questions answered at this time, reviewed local resources available. ? ? ?Feeding ?Mother's Current Feeding Choice: Breast Milk and Formula ?Nipple Type: Slow - flow ? ?Lactation Tools Discussed/Used ?Tools: Pump ?Breast pump type: Manual;Double-Electric Breast Pump ?Pump Education: Setup, frequency, and cleaning ?Pumping frequency: encouraged every 3h ? ?Interventions ?Interventions: Breast feeding basics reviewed;Skin to skin;Expressed milk;Education;Hand pump;DEBP ? ?Discharge ?Discharge Education: Engorgement and breast care ?Pump: Manual;DEBP;Personal ?WIC Program: Yes ? ?Consult Status ?Consult Status: Complete ?Date: 04/02/21 ?Follow-up type: Call as needed ? ? ? ?Sejla Marzano A Higuera Ancidey ?04/02/2021, 1:51 PM ? ? ? ?

## 2021-04-05 ENCOUNTER — Encounter: Payer: Medicaid Other | Admitting: Obstetrics and Gynecology

## 2021-04-07 ENCOUNTER — Other Ambulatory Visit: Payer: Self-pay

## 2021-04-07 ENCOUNTER — Ambulatory Visit (INDEPENDENT_AMBULATORY_CARE_PROVIDER_SITE_OTHER): Payer: Medicaid Other

## 2021-04-07 VITALS — BP 135/80 | HR 102 | Wt 168.3 lb

## 2021-04-07 DIAGNOSIS — O0990 Supervision of high risk pregnancy, unspecified, unspecified trimester: Secondary | ICD-10-CM

## 2021-04-07 DIAGNOSIS — Z0130 Encounter for examination of blood pressure without abnormal findings: Secondary | ICD-10-CM

## 2021-04-07 DIAGNOSIS — Z5189 Encounter for other specified aftercare: Secondary | ICD-10-CM

## 2021-04-07 MED ORDER — BLOOD PRESSURE KIT DEVI: 1.0000 | Freq: Once | 0 refills | Status: AC

## 2021-04-07 NOTE — Progress Notes (Signed)
Blood Pressure Check Visit ? ?Sabrina Dixon is here for blood pressure check following c-section on 03/30/21. Pain is well controlled. Pt was diagnosed with severe pre-e during admission for delivery. Pt reports completing short course of Lasix. Reports taking Nifedipine 60 mg daily; last taken this morning. BP today is 135/80. Reviewed with Alysia Penna, MD who states patient may continue Nifedipine and should return for PP appt or before if needed. Reviewed with patient when to check BP at home and normal BP parameters. ? ?Patient requests assistance with breast pump. Given information for how to order breast pump through insurance.  ? ?Marjo Bicker, RN ?04/07/2021  2:10 PM ? ?

## 2021-04-07 NOTE — Patient Instructions (Signed)
Fax number: [phone] ?

## 2021-04-12 ENCOUNTER — Encounter: Payer: Medicaid Other | Admitting: Family Medicine

## 2021-04-19 ENCOUNTER — Encounter: Payer: Medicaid Other | Admitting: Family Medicine

## 2021-04-27 ENCOUNTER — Encounter: Payer: Self-pay | Admitting: Family Medicine

## 2021-04-27 ENCOUNTER — Other Ambulatory Visit: Payer: Medicaid Other

## 2021-04-27 ENCOUNTER — Ambulatory Visit (INDEPENDENT_AMBULATORY_CARE_PROVIDER_SITE_OTHER): Payer: Medicaid Other | Admitting: Family Medicine

## 2021-04-27 ENCOUNTER — Other Ambulatory Visit: Payer: Self-pay

## 2021-04-27 DIAGNOSIS — O1413 Severe pre-eclampsia, third trimester: Secondary | ICD-10-CM

## 2021-04-27 DIAGNOSIS — Z30011 Encounter for initial prescription of contraceptive pills: Secondary | ICD-10-CM

## 2021-04-27 DIAGNOSIS — D62 Acute posthemorrhagic anemia: Secondary | ICD-10-CM | POA: Diagnosis not present

## 2021-04-27 DIAGNOSIS — O24419 Gestational diabetes mellitus in pregnancy, unspecified control: Secondary | ICD-10-CM

## 2021-04-27 LAB — CBC
Hematocrit: 32.8 % — ABNORMAL LOW (ref 34.0–46.6)
Hemoglobin: 9.9 g/dL — ABNORMAL LOW (ref 11.1–15.9)
MCH: 21 pg — ABNORMAL LOW (ref 26.6–33.0)
MCHC: 30.2 g/dL — ABNORMAL LOW (ref 31.5–35.7)
MCV: 70 fL — ABNORMAL LOW (ref 79–97)
Platelets: 289 10*3/uL (ref 150–450)
RBC: 4.71 x10E6/uL (ref 3.77–5.28)
RDW: 17.5 % — ABNORMAL HIGH (ref 11.7–15.4)
WBC: 5.3 10*3/uL (ref 3.4–10.8)

## 2021-04-27 MED ORDER — NORGESTIMATE-ETH ESTRADIOL 0.25-35 MG-MCG PO TABS: 1.0000 | ORAL_TABLET | Freq: Every day | ORAL | 11 refills | Status: DC

## 2021-04-27 MED ORDER — NIFEDIPINE ER 90 MG PO TB24: 90.0000 mg | ORAL_TABLET | Freq: Every day | ORAL | 3 refills | Status: DC

## 2021-04-27 NOTE — Progress Notes (Addendum)
? ? ?Post Partum Visit Note ? ?Sabrina Dixon is a 21 y.o. G8P1001 female who presents for a postpartum visit. She is 4 weeks postpartum following a primary cesarean section.  I have fully reviewed the prenatal and intrapartum course. The delivery was at 37.3 gestational weeks.  Anesthesia: spinal. Postpartum course has been uneventful. Baby is doing well. Baby is feeding by bottle - Enfamil Lipil. Bleeding staining only. Bowel function is normal. Bladder function is normal. Patient is not sexually active. Contraception method is condoms and OCP (estrogen/progesterone). Postpartum depression screening: negative. ? ? ?The pregnancy intention screening data noted above was reviewed. Potential methods of contraception were discussed. The patient elected to proceed with No data recorded. ? ? Edinburgh Postnatal Depression Scale - 04/27/21 1107   ? ?  ? Edinburgh Postnatal Depression Scale:  In the Past 7 Days  ? I have been able to laugh and see the funny side of things. 0   ? I have looked forward with enjoyment to things. 0   ? I have blamed myself unnecessarily when things went wrong. 0   ? I have been anxious or worried for no good reason. 0   ? I have felt scared or panicky for no good reason. 0   ? Things have been getting on top of me. 0   ? I have been so unhappy that I have had difficulty sleeping. 0   ? I have felt sad or miserable. 0   ? I have been so unhappy that I have been crying. 0   ? The thought of harming myself has occurred to me. 0   ? Edinburgh Postnatal Depression Scale Total 0   ? ?  ?  ? ?  ? ? ?Health Maintenance Due  ?Topic Date Due  ? URINE MICROALBUMIN  Never done  ? Hepatitis C Screening  Never done  ? ? ?The following portions of the patient's history were reviewed and updated as appropriate: allergies, current medications, past family history, past medical history, past social history, past surgical history, and problem list. ? ?Review of Systems ?Pertinent items noted in HPI and remainder  of comprehensive ROS otherwise negative. ? ?Objective:  ?BP (!) 143/95   Pulse 77   Wt 168 lb 12.8 oz (76.6 kg)   LMP  (LMP Unknown)   Breastfeeding No   BMI 32.97 kg/m?   ? ?General:  alert, cooperative, and appears stated age  ? Breasts:  not indicated  ?Lungs: Comfortable on room air  ?Wound well approximated incision  ?GU exam:  not indicated  ?     ?Assessment:  ? ? There are no diagnoses linked to this encounter. ? ?Normal postpartum exam.  ? ?Plan:  ? ?Essential components of care per ACOG recommendations: ? ?1.  Mood and well being: Patient with negative depression screening today. Reviewed local resources for support.  ?- Patient tobacco use? No.   ?- hx of drug use? No.   ? ?2. Infant care and feeding:  ?-Patient currently breastmilk feeding? No.  ?-Social determinants of health (SDOH) reviewed in EPIC. No concerns ? ?3. Sexuality, contraception and birth spacing ?- Patient does not want a pregnancy in the next year.  Desired family size is unsure number children.  ?- Reviewed reproductive life planning. Reviewed contraceptive methods based on pt preferences and effectiveness.  Patient desired Oral Contraceptive today.  Patient counseled not to start until 6 weeks post partum, and to wait at least one week before having unprotected  sex. ?- Discussed birth spacing of 18 months ? ?4. Sleep and fatigue ?-Encouraged family/partner/community support of 4 hrs of uninterrupted sleep to help with mood and fatigue ? ?5. Physical Recovery  ?- Discussed patients delivery and complications. She describes her labor as mixed. ?- Patient had a C-section failure to progress.  ?- Patient has urinary incontinence? No. ?- Patient is safe to resume physical and sexual activity ? ?6.  Health Maintenance ?- HM due items addressed Yes ?- Last pap smear No results found for: DIAGPAP Pap smear not done at today's visit.  ?-Breast Cancer screening indicated? No.  ? ?7. Chronic Disease/Pregnancy Condition follow up:  Hypertension ?- Pre-eclampsia: did not take nifedipine this morning but has been taking them.  ?-Anemia: hgb 7 on discharge, check CBC today ?- GDM: 2hr GTT today ?- PCP follow up ? ?Venora Maples, MD ?Center for [facility], [facility] ? ?

## 2021-04-28 ENCOUNTER — Other Ambulatory Visit: Payer: Medicaid Other

## 2021-04-28 ENCOUNTER — Ambulatory Visit: Payer: Medicaid Other | Admitting: Family Medicine

## 2021-04-28 LAB — GLUCOSE TOLERANCE, 2 HOURS
Glucose, 2 hour: 138 mg/dL (ref 70–139)
Glucose, GTT - Fasting: 100 mg/dL — ABNORMAL HIGH (ref 70–99)

## 2021-05-09 ENCOUNTER — Encounter: Payer: Self-pay | Admitting: Family Medicine

## 2021-09-13 ENCOUNTER — Encounter (HOSPITAL_COMMUNITY): Payer: Self-pay

## 2021-09-13 ENCOUNTER — Emergency Department (HOSPITAL_COMMUNITY)
Admission: EM | Admit: 2021-09-13 | Discharge: 2021-09-13 | Disposition: A | Discharge status: Home / Self Care | Payer: Medicaid Other | Attending: Student | Admitting: Student

## 2021-09-13 ENCOUNTER — Other Ambulatory Visit: Payer: Self-pay

## 2021-09-13 DIAGNOSIS — Z20822 Contact with and (suspected) exposure to covid-19: Secondary | ICD-10-CM | POA: Insufficient documentation

## 2021-09-13 DIAGNOSIS — J029 Acute pharyngitis, unspecified: Secondary | ICD-10-CM | POA: Insufficient documentation

## 2021-09-13 LAB — RESP PANEL BY RT-PCR (FLU A&B, COVID) ARPGX2
Influenza A by PCR: NEGATIVE
Influenza B by PCR: NEGATIVE
SARS Coronavirus 2 by RT PCR: NEGATIVE

## 2021-09-13 LAB — GROUP A STREP BY PCR: Group A Strep by PCR: NOT DETECTED

## 2021-09-13 MED ORDER — LIDOCAINE VISCOUS HCL 2 % MT SOLN: 15.0000 mL | OROMUCOSAL | 0 refills | Status: DC | PRN

## 2021-09-13 NOTE — Discharge Instructions (Signed)
Please return to ED with any new or worsening signs or symptoms Please pick up viscous lidocaine Please read attached guide concerning sore throat Please take ibuprofen/tylenol at home for pain

## 2021-09-13 NOTE — ED Provider Notes (Signed)
[facility]-EMERGENCY DEPT Provider Note   CSN: [id number] Arrival date & time: 09/13/21  1553     History  Chief Complaint  Patient presents with   Sore Throat    Sabrina Dixon is a 21 y.o. female  that presents to ED for evaluation of sore throat. Patient complains of sore throat for the last 2 days.  Denies sick contacts.  Denies fevers, nausea, vomiting, diarrhea, bodies or chills.  Denies any sexual contacts.    Sore Throat       Home Medications Prior to Admission medications   Medication Sig Start Date End Date Taking? Authorizing Provider  lidocaine (XYLOCAINE) 2 % solution Use as directed 15 mLs in the mouth or throat as needed for mouth pain. 09/13/21  Yes Al Decant, PA-C  NIFEdipine (ADALAT CC) 90 MG 24 hr tablet Take 1 tablet (90 mg total) by mouth daily. 04/27/21 05/27/21  Venora Maples, MD  norgestimate-ethinyl estradiol (ORTHO-CYCLEN) 0.25-35 MG-MCG tablet Take 1 tablet by mouth daily. 04/27/21   Venora Maples, MD  Prenatal Vit-Fe Fumarate-FA (MULTIVITAMIN-PRENATAL) 27-0.8 MG TABS tablet Take 1 tablet by mouth daily at 12 noon.    [provider]      Allergies    Patient has no known allergies.    Review of Systems   Review of Systems  Constitutional:  Negative for chills and fever.  HENT:  Positive for sore throat. Negative for trouble swallowing.   Gastrointestinal:  Negative for diarrhea, nausea and vomiting.  All other systems reviewed and are negative.   Physical Exam Updated Vital Signs BP (!) 131/90 (BP Location: Left Arm)   Pulse 70   Temp 98.8 F (37.1 C) (Oral)   Resp 18   Ht 5' (1.524 m)   Wt 84.1 kg   SpO2 100%   BMI 36.21 kg/m  Physical Exam Vitals and nursing note reviewed.  Constitutional:      General: She is not in acute distress.    Appearance: Normal appearance. She is not ill-appearing, toxic-appearing or diaphoretic.  HENT:     Head: Normocephalic and atraumatic.     Nose:  Nose normal. No congestion.     Mouth/Throat:     Mouth: Mucous membranes are moist.     Pharynx: Oropharynx is clear. Posterior oropharyngeal erythema present. No oropharyngeal exudate.  Eyes:     Extraocular Movements: Extraocular movements intact.     Pupils: Pupils are equal, round, and reactive to light.  Cardiovascular:     Rate and Rhythm: Normal rate and regular rhythm.  Pulmonary:     Effort: Pulmonary effort is normal.     Breath sounds: Normal breath sounds. No wheezing.  Abdominal:     General: Abdomen is flat. Bowel sounds are normal.     Palpations: Abdomen is soft.     Tenderness: There is no abdominal tenderness.  Musculoskeletal:     Cervical back: Normal range of motion and neck supple. No tenderness.  Skin:    General: Skin is warm and dry.     Capillary Refill: Capillary refill takes less than 2 seconds.  Neurological:     Mental Status: She is alert and oriented to person, place, and time.     ED Results / Procedures / Treatments   Labs (all labs ordered are listed, but only abnormal results are displayed) Labs Reviewed  RESP PANEL BY RT-PCR (FLU A&B, COVID) ARPGX2  GROUP A STREP BY PCR    EKG None  Radiology No results found.  Procedures Procedures   Medications Ordered in ED Medications - No data to display  ED Course/ Medical Decision Making/ A&P                           Medical Decision Making Risk Prescription drug management.   21 year old female presents for evaluation of sore throat.  Please see HPI for further details.  On examination, patient posterior oropharynx is erythematous slight exudate.  The patient tonsils are not swollen.  The patient uvula is midline.  The patient is handling secretions appropriately.  Patient viral testing is negative for COVID, flu.  Patient negative for strep throat.  Patient will be discharged home with viscous lidocaine, advised to treat symptoms with ibuprofen and Tylenol.  Patient advised  that this is most likely viral illness.  Patient denies any new sexual contact, recent oral sex.  Patient provided return precautions and she voiced understanding.  Patient had all of her questions answered to her satisfaction.  The patient is stable at this time for discharge home.  Final Clinical Impression(s) / ED Diagnoses Final diagnoses:  Sore throat    Rx / DC Orders ED Discharge Orders          Ordered    lidocaine (XYLOCAINE) 2 % solution  As needed        09/13/21 1903              Clent Ridges 09/13/21 1911    Kommor, Wyn Forster, MD 09/14/21 [phone]

## 2021-09-13 NOTE — ED Provider Triage Note (Signed)
Emergency Medicine Provider Triage Evaluation Note  Sabrina Dixon , a 21 y.o. female  was evaluated in triage.  Pt complains of sore throat for the last 2 days.  Denies sick contacts.  Denies fevers, nausea, vomiting, diarrhea, bodies or chills.  Denies any sexual contacts.  Review of Systems  Positive:  Negative:   Physical Exam  BP (!) 131/90 (BP Location: Left Arm)   Pulse 70   Temp 98.8 F (37.1 C) (Oral)   Resp 18   Ht 5' (1.524 m)   Wt 84.1 kg   SpO2 100%   BMI 36.21 kg/m  Gen:   Awake, no distress   Resp:  Normal effort  MSK:   Moves extremities without difficulty  Other:    Medical Decision Making  Medically screening exam initiated at 4:24 PM.  Appropriate orders placed.  Sabrina Dixon was informed that the remainder of the evaluation will be completed by another provider, this initial triage assessment does not replace that evaluation, and the importance of remaining in the ED until their evaluation is complete.     Al Decant, PA-C 09/13/21 1625

## 2021-09-13 NOTE — ED Notes (Signed)
Upon entering room to provide discharge education, pt could not be located in exam room or ED.

## 2021-09-13 NOTE — ED Triage Notes (Signed)
Pt reports sore throat and swelling that began yesterday.

## 2022-02-02 ENCOUNTER — Ambulatory Visit (INDEPENDENT_AMBULATORY_CARE_PROVIDER_SITE_OTHER): Payer: Medicaid Other | Admitting: *Deleted

## 2022-02-02 ENCOUNTER — Encounter: Payer: Self-pay | Admitting: *Deleted

## 2022-02-02 DIAGNOSIS — Z3201 Encounter for pregnancy test, result positive: Secondary | ICD-10-CM

## 2022-02-02 DIAGNOSIS — O1413 Severe pre-eclampsia, third trimester: Secondary | ICD-10-CM

## 2022-02-02 DIAGNOSIS — Z3200 Encounter for pregnancy test, result unknown: Secondary | ICD-10-CM

## 2022-02-02 LAB — POCT PREGNANCY, URINE: Preg Test, Ur: POSITIVE — AB

## 2022-02-02 MED ORDER — NIFEDIPINE ER 90 MG PO TB24: 90.0000 mg | ORAL_TABLET | Freq: Every day | ORAL | 3 refills | Status: DC

## 2022-02-02 NOTE — Progress Notes (Signed)
Sabrina Dixon dropped off a urine for pregnancy test . Pregnancy test was positive. I called [name] and left a message I was calling re: results and will call again in a few moments. [name] 4:26 I called [name] again. I informed her upt was positive. She reports sure LMP of 12/26/21 which makes her [redacted]w[redacted]d with EDD 10/02/22. I advised her to start prenatal care with provider of her choice. She plans to go here again. I advised her to call tomorrow to schedule new ob visit. I reviewed meds with her and she is still taking PNV since her last pregnancy.  She is also taking Nifedipine 90 mg. We discussed this shows as expired. She states she was told to continue taking at her postpartum visit and she has about 2 weeks left. I explained I will discuss with provider and we will either send in refill or changed med and send in new rx but that I would contact her either way. She voices understanding. [name] 4:47 I reviewed pt history , and that she is now pregnant again and is almost out of Nifedipine. States she could continue and approve refills to be sent in. Message sent to [facility].  [name]

## 2022-02-02 NOTE — Patient Instructions (Signed)
Prenatal Care Providers           [facility]  [address] [phone]  [facility]   [address]  [phone]  [facility] [place]       [address] [phone]            [facility] [place]     [address] [phone]          [facility] [place]   [address] [phone]  [facility]  [address] [phone]     [facility]  [address]   [phone]     [facility]  Phone: [phone]  [facility]  Phone: [phone]  [facility] Phone: [phone]  [facility] Phone: [phone]  [facility] Phone: [phone]  [facility] Phone: [phone]  [facility]  Phone: [phone]  

## 2022-02-13 ENCOUNTER — Inpatient Hospital Stay (HOSPITAL_COMMUNITY)
Admission: AD | Admit: 2022-02-13 | Discharge: 2022-02-14 | Disposition: A | Discharge status: Home / Self Care | Payer: Medicaid Other | Attending: Obstetrics and Gynecology | Admitting: Obstetrics and Gynecology

## 2022-02-13 ENCOUNTER — Encounter (HOSPITAL_COMMUNITY): Payer: Self-pay

## 2022-02-13 ENCOUNTER — Other Ambulatory Visit: Payer: Self-pay

## 2022-02-13 DIAGNOSIS — O209 Hemorrhage in early pregnancy, unspecified: Secondary | ICD-10-CM | POA: Diagnosis present

## 2022-02-13 DIAGNOSIS — R109 Unspecified abdominal pain: Secondary | ICD-10-CM | POA: Insufficient documentation

## 2022-02-13 DIAGNOSIS — O26891 Other specified pregnancy related conditions, first trimester: Secondary | ICD-10-CM | POA: Insufficient documentation

## 2022-02-13 DIAGNOSIS — Z3A01 Less than 8 weeks gestation of pregnancy: Secondary | ICD-10-CM | POA: Diagnosis not present

## 2022-02-13 HISTORY — DX: Essential (primary) hypertension: I10

## 2022-02-13 NOTE — MAU Note (Signed)
.  Sabrina Dixon is a 22 y.o. at [redacted]w[redacted]d here in MAU reporting: for the past 3 days have been spotting - "has not been go to the doctor worthy until today" states bleeding is "more red" and increase in vaginal discharge. Denies the need to use a pad. Denies recent intercourse. Reports lower abdominal cramping that comes and goes.    Pain score: 5 Vitals:   02/13/22 2341  BP: 131/70  Pulse: 68  Resp: 16  Temp: 98.4 F (36.9 C)  SpO2: 100%     FHT:NA  Lab orders placed from triage:  UA

## 2022-02-14 ENCOUNTER — Inpatient Hospital Stay (HOSPITAL_COMMUNITY): Payer: Medicaid Other

## 2022-02-14 DIAGNOSIS — Z3A01 Less than 8 weeks gestation of pregnancy: Secondary | ICD-10-CM

## 2022-02-14 DIAGNOSIS — R109 Unspecified abdominal pain: Secondary | ICD-10-CM

## 2022-02-14 LAB — GC/CHLAMYDIA PROBE AMP (~~LOC~~) NOT AT ARMC
Chlamydia: NEGATIVE
Comment: NEGATIVE
Comment: NORMAL
Neisseria Gonorrhea: NEGATIVE

## 2022-02-14 LAB — WET PREP, GENITAL
Clue Cells Wet Prep HPF POC: NONE SEEN
Sperm: NONE SEEN
Trich, Wet Prep: NONE SEEN
WBC, Wet Prep HPF POC: >=10 — AB (ref <10)
Yeast Wet Prep HPF POC: NONE SEEN

## 2022-02-14 LAB — CBC
HCT: 35.4 % — ABNORMAL LOW (ref 36.0–46.0)
Hemoglobin: 10.8 g/dL — ABNORMAL LOW (ref 12.0–15.0)
MCH: 20.5 pg — ABNORMAL LOW (ref 26.0–34.0)
MCHC: 30.5 g/dL (ref 30.0–36.0)
MCV: 67.2 fL — ABNORMAL LOW (ref 80.0–100.0)
Platelets: 260 10*3/uL (ref 150–400)
RBC: 5.27 MIL/uL — ABNORMAL HIGH (ref 3.87–5.11)
RDW: 16.2 % — ABNORMAL HIGH (ref 11.5–15.5)
WBC: 5.8 10*3/uL (ref 4.0–10.5)
nRBC: 0 % (ref 0.0–0.2)

## 2022-02-14 LAB — HCG, QUANTITATIVE, PREGNANCY: hCG, Beta Chain, Quant, S: 31277 m[IU]/mL — ABNORMAL HIGH (ref <5)

## 2022-02-14 LAB — URINALYSIS, ROUTINE W REFLEX MICROSCOPIC
Bilirubin Urine: NEGATIVE
Glucose, UA: NEGATIVE mg/dL
Hgb urine dipstick: NEGATIVE
Ketones, ur: 5 mg/dL — AB
Leukocytes,Ua: NEGATIVE
Nitrite: NEGATIVE
Protein, ur: NEGATIVE mg/dL
Specific Gravity, Urine: 1.017 (ref 1.005–1.030)
pH: 6 (ref 5.0–8.0)

## 2022-02-14 NOTE — MAU Provider Note (Signed)
History     CSN: [id number]  Arrival date and time: 02/13/22 2329   Event Date/Time   First Provider Initiated Contact with Patient 02/14/22 0020      Chief Complaint  Patient presents with   Vaginal Bleeding   Abdominal Pain   Sabrina Dixon , a  21 y.o. G2P1001 at [redacted]w[redacted]d presents to MAU with complaints of vaginal bleeding and cramping for the last 3 days. Patient reports that she has been spotting for the last 3 days but today bleeding became "bright red" and increased in amount. Patient denies having to wear a pad and denies passing clots. She denies recent intercourse and abnormal vaginal discharge. She also reports on-going intermittent abdominal pain. She describes pain as "cramping" and currently rates 5/10. She denies attempting to relieve symptoms. She denies constipation and diarrhea.          OB History     Gravida  2   Para  1   Term  1   Preterm      AB      Living  1      SAB      IAB      Ectopic      Multiple  0   Live Births  1           Past Medical History:  Diagnosis Date   Asthma    Chlamydia    GC (gonococcus)    Hypertension     Past Surgical History:  Procedure Laterality Date   CESAREAN SECTION N/A 03/30/2021   Procedure: CESAREAN SECTION;  Surgeon: Adam Phenix, MD;  Location: [facility] LD [facility]S;  Service: Obstetrics;  Laterality: N/A;   NO PAST SURGERIES      Family History  Problem Relation Age of Onset   Hypertension Mother    Cancer Neg Hx    Diabetes Neg Hx     Social History   Tobacco Use   Smoking status: Never   Smokeless tobacco: Never  Vaping Use   Vaping Use: Former   Substances: Flavoring  Substance Use Topics   Alcohol use: Not Currently   Drug use: Not Currently    Types: Marijuana    Comment: last marijuana on 1/1    Allergies: No Known Allergies  Medications Prior to Admission  Medication Sig Dispense Refill Last Dose   NIFEdipine (ADALAT CC) 90 MG 24 hr tablet Take 1 tablet (90 mg total) by  mouth daily. 90 tablet 3 02/13/2022   Prenatal Vit-Fe Fumarate-FA (MULTIVITAMIN-PRENATAL) 27-0.8 MG TABS tablet Take 1 tablet by mouth daily at 12 noon.   02/13/2022   lidocaine (XYLOCAINE) 2 % solution Use as directed 15 mLs in the mouth or throat as needed for mouth pain. 100 mL 0    norgestimate-ethinyl estradiol (ORTHO-CYCLEN) 0.25-35 MG-MCG tablet Take 1 tablet by mouth daily. 28 tablet 11     Review of Systems  Constitutional:  Negative for chills, fatigue and fever.  Eyes:  Negative for pain and visual disturbance.  Respiratory:  Negative for apnea, shortness of breath and wheezing.   Cardiovascular:  Negative for chest pain and palpitations.  Gastrointestinal:  Positive for abdominal pain. Negative for constipation, diarrhea, nausea and vomiting.  Genitourinary:  Positive for vaginal bleeding. Negative for difficulty urinating, dysuria, pelvic pain, vaginal discharge and vaginal pain.  Musculoskeletal:  Negative for back pain.  Neurological:  Negative for seizures, weakness and headaches.  Psychiatric/Behavioral:  Negative for suicidal ideas.    Physical Exam  Blood pressure 131/70, pulse 68, temperature 98.4 F (36.9 C), temperature source Oral, resp. rate 16, height 5' (1.524 m), weight 84.4 kg, last menstrual period 12/26/2021, SpO2 100 %, not currently breastfeeding.  Physical Exam Vitals and nursing note reviewed.  Constitutional:      General: She is not in acute distress.    Appearance: Normal appearance.  HENT:     Head: Normocephalic.  Pulmonary:     Effort: Pulmonary effort is normal.  Abdominal:     Palpations: Abdomen is soft.     Tenderness: There is no abdominal tenderness.  Musculoskeletal:     Cervical back: Normal range of motion.  Skin:    General: Skin is warm and dry.  Neurological:     Mental Status: She is alert and oriented to person, place, and time.  Psychiatric:        Mood and Affect: Mood normal.     MAU Course  Procedures Orders  Placed This Encounter  Procedures   Wet prep, genital   US OB LESS THAN 14 WEEKS WITH OB TRANSVAGINAL   CBC   hCG, quantitative, pregnancy   Urinalysis, Routine w reflex microscopic Urine, Clean Catch   Diet NPO time specified   Discharge patient   Results for orders placed or performed during the hospital encounter of 02/13/22 (from the past 24 hour(s))  Wet prep, genital     Status: Abnormal   Collection Time: 02/14/22 12:15 AM  Result Value Ref Range   Yeast Wet Prep HPF POC NONE SEEN NONE SEEN   Trich, Wet Prep NONE SEEN NONE SEEN   Clue Cells Wet Prep HPF POC NONE SEEN NONE SEEN   WBC, Wet Prep HPF POC >=10 (A) <10   Sperm NONE SEEN   Urinalysis, Routine w reflex microscopic Urine, Clean Catch     Status: Abnormal   Collection Time: 02/14/22 12:17 AM  Result Value Ref Range   Color, Urine YELLOW YELLOW   APPearance CLEAR CLEAR   Specific Gravity, Urine 1.017 1.005 - 1.030   pH 6.0 5.0 - 8.0   Glucose, UA NEGATIVE NEGATIVE mg/dL   Hgb urine dipstick NEGATIVE NEGATIVE   Bilirubin Urine NEGATIVE NEGATIVE   Ketones, ur 5 (A) NEGATIVE mg/dL   Protein, ur NEGATIVE NEGATIVE mg/dL   Nitrite NEGATIVE NEGATIVE   Leukocytes,Ua NEGATIVE NEGATIVE  CBC     Status: Abnormal   Collection Time: 02/14/22 12:19 AM  Result Value Ref Range   WBC 5.8 4.0 - 10.5 K/uL   RBC 5.27 (H) 3.87 - 5.11 MIL/uL   Hemoglobin 10.8 (L) 12.0 - 15.0 g/dL   HCT 35.4 (L) 36.0 - 46.0 %   MCV 67.2 (L) 80.0 - 100.0 fL   MCH 20.5 (L) 26.0 - 34.0 pg   MCHC 30.5 30.0 - 36.0 g/dL   RDW 16.2 (H) 11.5 - 15.5 %   Platelets 260 150 - 400 K/uL   nRBC 0.0 0.0 - 0.2 %  hCG, quantitative, pregnancy     Status: Abnormal   Collection Time: 02/14/22 12:19 AM  Result Value Ref Range   hCG, Beta Chain, Quant, S 31,277 (H) <5 mIU/mL   US OB LESS THAN 14 WEEKS WITH OB TRANSVAGINAL  Result Date: 02/14/2022 CLINICAL DATA:  [id number] Vaginal bleeding in pregnancy, first trimester 093818. Last menstrual period 12/26/2021.  Gestational age by last menstrual period 7 weeks and 1 day. Estimated due date by last menstrual 10/02/2022. EXAM: OBSTETRIC <14 WK [place] AND TRANSVAGINAL OB US TECHNIQUE:  Both transabdominal and transvaginal ultrasound examinations were performed for complete evaluation of the gestation as well as the maternal uterus, adnexal regions, and pelvic cul-de-sac. Transvaginal technique was performed to assess early pregnancy. COMPARISON:  None Available. FINDINGS: Intrauterine gestational sac: Single Yolk sac:  Visualized. Embryo:  Visualized. Cardiac Activity: Visualized. Heart Rate: 141  bpm CRL:  10.3 mm   7 w   1 d                  [place] EDC: 10/02/2022 Subchorionic hemorrhage:  None visualized. Maternal uterus/adnexae: Bilateral ovaries are unremarkable. The uterus is otherwise unremarkable. Other: No free fluid. IMPRESSION: Single live intrauterine pregnancy with gestational age by ultrasound of 7 weeks and 1 day which is concordant with gestational age by last menstrual of 7 weeks and 1 day. Electronically Signed   By: Iven Finn M.D.   On: 02/14/2022 00:57     MDM - Wet prep positive for WBC otherwise normal  - UA 5+ otherwise normal  - Hgb 10.8 and Platelet count normal. Patient hemodynamically stable  - Quant >31000 - [place] results revealed a single living IUP ~[redacted]w[redacted]d, consistent with LMP. - Plan for discharge.    Assessment and Plan   1. Abdominal pain, unspecified abdominal location   2. [redacted] weeks gestation of pregnancy    - Reviewed findings with patient and patient pleasantly relieved.  - Recommended to seek [facility] at the office of her choice. Patient has a NOB scheduled for February at [facility] - Worsening signs and return precautions reviewed.  - Patient discharged home in stable condition and may return to MAU as needed.   Jacquiline Doe, MSN CNM  02/14/2022, 12:20 AM

## 2022-02-14 NOTE — Discharge Instructions (Signed)

## 2022-02-28 ENCOUNTER — Encounter: Payer: Self-pay | Admitting: Family Medicine

## 2022-03-02 ENCOUNTER — Telehealth (INDEPENDENT_AMBULATORY_CARE_PROVIDER_SITE_OTHER): Payer: Medicaid Other | Admitting: *Deleted

## 2022-03-02 DIAGNOSIS — O209 Hemorrhage in early pregnancy, unspecified: Secondary | ICD-10-CM | POA: Insufficient documentation

## 2022-03-02 DIAGNOSIS — Z8759 Personal history of other complications of pregnancy, childbirth and the puerperium: Secondary | ICD-10-CM | POA: Insufficient documentation

## 2022-03-02 DIAGNOSIS — O09299 Supervision of pregnancy with other poor reproductive or obstetric history, unspecified trimester: Secondary | ICD-10-CM | POA: Insufficient documentation

## 2022-03-02 DIAGNOSIS — O0991 Supervision of high risk pregnancy, unspecified, first trimester: Secondary | ICD-10-CM

## 2022-03-02 DIAGNOSIS — Z98891 History of uterine scar from previous surgery: Secondary | ICD-10-CM

## 2022-03-02 DIAGNOSIS — O0990 Supervision of high risk pregnancy, unspecified, unspecified trimester: Secondary | ICD-10-CM | POA: Insufficient documentation

## 2022-03-02 DIAGNOSIS — O09899 Supervision of other high risk pregnancies, unspecified trimester: Secondary | ICD-10-CM | POA: Insufficient documentation

## 2022-03-02 DIAGNOSIS — Z3A09 9 weeks gestation of pregnancy: Secondary | ICD-10-CM

## 2022-03-02 DIAGNOSIS — O99210 Obesity complicating pregnancy, unspecified trimester: Secondary | ICD-10-CM | POA: Insufficient documentation

## 2022-03-02 HISTORY — DX: Hemorrhage in early pregnancy, unspecified: O20.9

## 2022-03-02 MED ORDER — GOJJI WEIGHT SCALE MISC: 1.0000 | 0 refills | Status: DC | PRN

## 2022-03-02 NOTE — Progress Notes (Addendum)
New OB Intake  I connected with Sabrina Dixon  on 03/02/22 at 10:08 by MyChart Video Visit and verified that I am speaking with the correct person using two identifiers. Nurse is located at [facility] and pt is located at home.  I discussed the limitations, risks, security and privacy concerns of performing an evaluation and management service by telephone and the availability of in person appointments. I also discussed with the patient that there may be a patient responsible charge related to this service. The patient expressed understanding and agreed to proceed.  I explained I am completing New OB Intake today. We discussed EDD of 10/02/22 that is based on LMP of 12/26/21. Pt is G2/P1001. I reviewed her allergies, medications, Medical/Surgical/OB history, and appropriate screenings. I informed her of [facility] services. [facility] information placed in AVS. Based on history, this is a high risk pregnancy.  Patient Active Problem List   Diagnosis Date Noted   Supervision of high risk pregnancy, antepartum 03/02/2022   History of pre-eclampsia in prior pregnancy, currently pregnant 03/02/2022   Obesity affecting pregnancy 03/02/2022   History of gestational diabetes mellitus (GDM) in prior pregnancy, currently pregnant 03/02/2022   History of C-section 03/02/2022   Short interval between pregnancies affecting pregnancy, antepartum 03/02/2022   Vaginal bleeding in pregnancy, first trimester 03/02/2022    Concerns addressed today  Delivery Plans Plans to deliver at [facility]. Patient given information for [facility] Healthy Baby website for more information about Women's and [place]. Patient is not interested in water birth.   MyChart/Babyscripts MyChart access verified. I explained pt will have some visits in office and some virtually. Babyscripts instructions given and order placed.   Blood Pressure Cuff/Weight Scale Patient has cuff from last pregnancy.  Explained after first prenatal appt pt will  check weekly and document in 69. Patient does not have weight scale; order sent to [place], patient may track weight weekly in Babyscripts.  Anatomy US Explained first scheduled [place] will be around 19 weeks. Anatomy US scheduled for 05/08/22 at 1:30. Pt notified to arrive at 1:15.  Labs Discussed [name] genetic screening with patient. Unsure about whether she wants either Panorama and Horizon drawn at new OB visit. Routine prenatal labs needed.  COVID Vaccine Patient has not had COVID vaccine.   Is patient a CenteringPregnancy candidate?  Accepted   Is patient a Mom+Baby Combined Care candidate?  Not a candidate     Social Determinants of Health Food Insecurity: Patient denies food insecurity. WIC Referral: Patient is not interested in referral to [facility].  Transportation: Patient denies transportation needs. Childcare: Discussed no children allowed at ultrasound appointments. Offered childcare services; patient declines childcare services at this time.  Interested in [place]? No  First visit review I reviewed new OB appt with patient. I explained they will have a provider visit that includes pregnancy exam, labs. Explained pt will be seen by Dr. Roselie Awkward at first visit; encounter routed to appropriate provider. Explained that patient will be seen by pregnancy navigator following visit with provider.   Vaughan Basta [facility] 03/02/2022  11:03 AM

## 2022-03-02 NOTE — Patient Instructions (Signed)
  At our [facility] OB/GYN Practices, we work as an integrated team, providing care to address both physical and emotional health. Your medical provider may refer you to see our [facility]) ;scheduled virtually at your convenience.  Our [facility] is available to all patients, visits generally last between 20-30 minutes, but can be longer or shorter, depending on patient need. The [facility] offers help with stress management, coping with symptoms of depression and anxiety, major life changes , sleep issues, changing risky behavior, grief and loss, life stress, working on personal life goals, and  behavioral health issues, as these all affect your overall health and wellness.  The [facility] is NOT available for the following: FMLA paperwork, court-ordered evaluations, specialty assessments (custody or disability), letters to employers, or obtaining certification for an emotional support animal. The [facility] does not generally provide long-term therapy. You have the right to refuse integrated behavioral health services, or to reschedule to see the [facility] at a later date.  Confidentiality exception: If it is suspected that a child or disabled adult is being abused or neglected, we are required by law to report that to either Child Protective Services or Adult Scientist, forensic.  If you have a diagnosis of Bipolar affective disorder, Schizophrenia, or recurrent Major depressive disorder, we will recommend that you establish care with a psychiatrist, as these are lifelong, chronic conditions, and we want your overall emotional health and medications to be more closely monitored. If you anticipate needing extended maternity leave due to mental health issues postpartum, it it recommended you inform your medical provider, so we can put in a referral to a psychiatrist as soon as possible. The [facility] is unable to recommend an extended maternity leave for mental health issues. Your medical provider or [facility] may refer you to a  therapist for ongoing, traditional therapy, or to a psychiatrist, for medication management, if it would benefit your overall health. Depending on your insurance, you may have a copay or be charged a deductible, depending on your insurance, to see the [facility]. If you are uninsured, it is recommended that you apply for financial assistance. (Forms may be requested at the front desk for in-person visits, via MyChart, or request a form during a virtual visit).  If you see the [facility] more than 6 times, you will have to complete a comprehensive clinical assessment interview with the [facility] to resume integrated services.  For virtual visits with the [facility], you must be physically in the state of [place] at the time of the visit. For example, if you live in [place], you will have to do an in-person visit with the [facility], and your out-of-state insurance may not cover behavioral health services in [place]. If you are going out of the state or country for any reason, the [facility] may see you virtually when you return to [place], but not while you are physically outside of [place].    [email or web address] is a website with information to help you prepare for Labor and delivery, patient information, visitor information and more.    Here is a link to the Pregnancy Navigators . Please Fill out prior to your New OB appointment.   English Link: [email or web address]  Spanish Link: [email or web address]

## 2022-03-15 ENCOUNTER — Encounter: Payer: Self-pay | Admitting: Family Medicine

## 2022-03-16 ENCOUNTER — Ambulatory Visit: Payer: Medicaid Other | Admitting: Obstetrics & Gynecology

## 2022-03-16 ENCOUNTER — Encounter: Payer: Self-pay | Admitting: Obstetrics & Gynecology

## 2022-03-16 ENCOUNTER — Other Ambulatory Visit (HOSPITAL_COMMUNITY)
Admission: RE | Admit: 2022-03-16 | Discharge: 2022-03-16 | Disposition: A | Discharge status: Home / Self Care | Payer: Medicaid Other | Source: Ambulatory Visit | Attending: Obstetrics & Gynecology | Admitting: Obstetrics & Gynecology

## 2022-03-16 VITALS — BP 126/70 | HR 82 | Wt 186.0 lb

## 2022-03-16 DIAGNOSIS — Z3A11 11 weeks gestation of pregnancy: Secondary | ICD-10-CM

## 2022-03-16 DIAGNOSIS — Z98891 History of uterine scar from previous surgery: Secondary | ICD-10-CM | POA: Diagnosis not present

## 2022-03-16 DIAGNOSIS — O0990 Supervision of high risk pregnancy, unspecified, unspecified trimester: Secondary | ICD-10-CM | POA: Insufficient documentation

## 2022-03-16 DIAGNOSIS — O99211 Obesity complicating pregnancy, first trimester: Secondary | ICD-10-CM

## 2022-03-16 DIAGNOSIS — O0991 Supervision of high risk pregnancy, unspecified, first trimester: Secondary | ICD-10-CM | POA: Diagnosis not present

## 2022-03-16 DIAGNOSIS — O09299 Supervision of pregnancy with other poor reproductive or obstetric history, unspecified trimester: Secondary | ICD-10-CM

## 2022-03-16 DIAGNOSIS — O09291 Supervision of pregnancy with other poor reproductive or obstetric history, first trimester: Secondary | ICD-10-CM | POA: Diagnosis not present

## 2022-03-16 DIAGNOSIS — D563 Thalassemia minor: Secondary | ICD-10-CM

## 2022-03-16 DIAGNOSIS — O99210 Obesity complicating pregnancy, unspecified trimester: Secondary | ICD-10-CM

## 2022-03-16 NOTE — Progress Notes (Signed)
  Subjective: CS 10 mo ago    Sabrina Dixon is a G2P1001 6w3dbeing seen today for her first obstetrical visit.  Her obstetrical history is significant for  CS, recent delivery . Patient does intend to breast feed. Pregnancy history fully reviewed.  Patient reports no complaints.  Vitals:   03/16/22 1012  BP: 126/70  Pulse: 82  Weight: 84.4 kg    HISTORY: OB History  Gravida Para Term Preterm AB Living  2 1 1     1  $ SAB IAB Ectopic Multiple Live Births        0 1    # Outcome Date GA Lbr Len/2nd Weight Sex Delivery Anes PTL Lv  2 Current           1 Term 03/30/21 373w3d2.75 kg F CS-Vac EPI  LIV     Birth Comments: Pre-eclampsia, GDM diet controlled   Past Medical History:  Diagnosis Date   Asthma    Chlamydia    GC (gonococcus)    History of gestational diabetes mellitus (GDM) 2023   Hypertension    Past Surgical History:  Procedure Laterality Date   CESAREAN SECTION N/A 03/30/2021   Procedure: CESAREAN SECTION;  Surgeon: ArWoodroe ModeMD;  Location: [facility];  Service: Obstetrics;  Laterality: N/A;   NO PAST SURGERIES     Family History  Problem Relation Age of Onset   Hypertension Mother    Cancer Neg Hx    Diabetes Neg Hx      Exam    Uterus:   12  Pelvic Exam:    Perineum:    Vulva:    Vagina:     pH:    Cervix:    Adnexa:    Bony Pelvis:   System: Breast:  normal appearance, no masses or tenderness   Skin: normal coloration and turgor, no rashes    Neurologic: oriented, normal mood   Extremities: normal strength, tone, and muscle mass   HEENT PERRLA   Mouth/Teeth mucous membranes moist, pharynx normal without lesions   Neck supple   Cardiovascular: regular rate and rhythm   Respiratory:  appears well, vitals normal, no respiratory distress, acyanotic, normal RR   Abdomen: soft, non-tender; bowel sounds normal; no masses,  no organomegaly   Urinary:       Assessment:    Pregnancy: G2P1001 Patient Active Problem List   Diagnosis Date  Noted   Supervision of high risk pregnancy, antepartum 03/02/2022   History of pre-eclampsia in prior pregnancy, currently pregnant 03/02/2022   Obesity affecting pregnancy 03/02/2022   History of gestational diabetes mellitus (GDM) in prior pregnancy, currently pregnant 03/02/2022   History of C-section 03/02/2022   Short interval between pregnancies affecting pregnancy, antepartum 03/02/2022   Vaginal bleeding in pregnancy, first trimester 03/02/2022        Plan:     Initial labs drawn. Prenatal vitamins. Problem list reviewed and updated. Genetic Screening discussed : ordered.  Ultrasound discussed; fetal survey: ordered.  Follow up in 4 weeks. 50% of 30 min visit spent on counseling and coordination of care.  Discussed VBAC   JaEmeterio Reeve/15/2024

## 2022-03-17 LAB — CBC/D/PLT+RPR+RH+ABO+RUBIGG...
Antibody Screen: NEGATIVE
Basophils Absolute: 0 10*3/uL (ref 0.0–0.2)
Basos: 0 %
EOS (ABSOLUTE): 0.2 10*3/uL (ref 0.0–0.4)
Eos: 3 %
HCV Ab: NONREACTIVE
HIV Screen 4th Generation wRfx: NONREACTIVE
Hematocrit: 34.9 % (ref 34.0–46.6)
Hemoglobin: 11 g/dL — ABNORMAL LOW (ref 11.1–15.9)
Hepatitis B Surface Ag: NEGATIVE
Immature Grans (Abs): 0 10*3/uL (ref 0.0–0.1)
Immature Granulocytes: 0 %
Lymphocytes Absolute: 1.8 10*3/uL (ref 0.7–3.1)
Lymphs: 29 %
MCH: 20.9 pg — ABNORMAL LOW (ref 26.6–33.0)
MCHC: 31.5 g/dL (ref 31.5–35.7)
MCV: 66 fL — ABNORMAL LOW (ref 79–97)
Monocytes Absolute: 0.3 10*3/uL (ref 0.1–0.9)
Monocytes: 5 %
Neutrophils Absolute: 4 10*3/uL (ref 1.4–7.0)
Neutrophils: 63 %
Platelets: 288 10*3/uL (ref 150–450)
RBC: 5.27 x10E6/uL (ref 3.77–5.28)
RDW: 16.5 % — ABNORMAL HIGH (ref 11.7–15.4)
RPR Ser Ql: NONREACTIVE
Rh Factor: POSITIVE
Rubella Antibodies, IGG: 2.02 index (ref >0.99)
WBC: 6.4 10*3/uL (ref 3.4–10.8)

## 2022-03-17 LAB — GC/CHLAMYDIA PROBE AMP (~~LOC~~) NOT AT ARMC
Chlamydia: NEGATIVE
Comment: NEGATIVE
Comment: NORMAL
Neisseria Gonorrhea: NEGATIVE

## 2022-03-17 LAB — HCV INTERPRETATION

## 2022-03-21 LAB — URINE CULTURE, OB REFLEX

## 2022-03-21 LAB — CULTURE, OB URINE

## 2022-03-22 ENCOUNTER — Encounter: Payer: Self-pay | Admitting: Family Medicine

## 2022-03-22 ENCOUNTER — Encounter: Payer: Self-pay | Admitting: Obstetrics & Gynecology

## 2022-03-22 DIAGNOSIS — J4520 Mild intermittent asthma, uncomplicated: Secondary | ICD-10-CM

## 2022-03-22 LAB — PANORAMA PRENATAL TEST FULL PANEL:PANORAMA TEST PLUS 5 ADDITIONAL MICRODELETIONS: FETAL FRACTION: 4.8

## 2022-03-27 LAB — HORIZON CUSTOM: REPORT SUMMARY: POSITIVE — AB

## 2022-03-29 DIAGNOSIS — D563 Thalassemia minor: Secondary | ICD-10-CM | POA: Insufficient documentation

## 2022-03-29 MED ORDER — NITROFURANTOIN MONOHYD MACRO 100 MG PO CAPS: 100.0000 mg | ORAL_CAPSULE | Freq: Two times a day (BID) | ORAL | 0 refills | Status: DC

## 2022-03-29 NOTE — Addendum Note (Signed)
Addended by: Woodroe Mode on: 03/29/2022 02:14 PM   Modules accepted: Orders

## 2022-03-30 ENCOUNTER — Encounter: Payer: Self-pay | Admitting: Lactation Services

## 2022-04-04 ENCOUNTER — Encounter: Payer: Self-pay | Admitting: *Deleted

## 2022-04-14 ENCOUNTER — Encounter: Payer: Self-pay | Admitting: Medical

## 2022-04-14 ENCOUNTER — Ambulatory Visit (INDEPENDENT_AMBULATORY_CARE_PROVIDER_SITE_OTHER): Payer: Medicaid Other | Admitting: Medical

## 2022-04-14 VITALS — BP 139/78 | HR 98 | Wt 189.3 lb

## 2022-04-14 DIAGNOSIS — O09292 Supervision of pregnancy with other poor reproductive or obstetric history, second trimester: Secondary | ICD-10-CM

## 2022-04-14 DIAGNOSIS — D563 Thalassemia minor: Secondary | ICD-10-CM

## 2022-04-14 DIAGNOSIS — O0990 Supervision of high risk pregnancy, unspecified, unspecified trimester: Secondary | ICD-10-CM

## 2022-04-14 DIAGNOSIS — N949 Unspecified condition associated with female genital organs and menstrual cycle: Secondary | ICD-10-CM

## 2022-04-14 DIAGNOSIS — M5431 Sciatica, right side: Secondary | ICD-10-CM

## 2022-04-14 DIAGNOSIS — O99212 Obesity complicating pregnancy, second trimester: Secondary | ICD-10-CM

## 2022-04-14 DIAGNOSIS — O09299 Supervision of pregnancy with other poor reproductive or obstetric history, unspecified trimester: Secondary | ICD-10-CM

## 2022-04-14 DIAGNOSIS — O0992 Supervision of high risk pregnancy, unspecified, second trimester: Secondary | ICD-10-CM

## 2022-04-14 DIAGNOSIS — Z3A15 15 weeks gestation of pregnancy: Secondary | ICD-10-CM

## 2022-04-14 DIAGNOSIS — Z8632 Personal history of gestational diabetes: Secondary | ICD-10-CM

## 2022-04-14 DIAGNOSIS — Z98891 History of uterine scar from previous surgery: Secondary | ICD-10-CM

## 2022-04-14 MED ORDER — COMPLETENATE 29-1 MG PO CHEW: 1.0000 | CHEWABLE_TABLET | Freq: Every day | ORAL | 11 refills | Status: AC

## 2022-04-14 NOTE — Progress Notes (Signed)
PRENATAL VISIT NOTE  Subjective:  Sabrina Dixon is a 22 y.o. G2P1001 at [redacted]w[redacted]d being seen today for ongoing prenatal care.  She is currently monitored for the following issues for this high-risk pregnancy and has Supervision of high risk pregnancy, antepartum; History of pre-eclampsia in prior pregnancy, currently pregnant; Obesity affecting pregnancy; History of gestational diabetes mellitus (GDM) in prior pregnancy, currently pregnant; History of C-section; Short interval between pregnancies affecting pregnancy, antepartum; Vaginal bleeding in pregnancy, first trimester; and Alpha thalassemia silent carrier on their problem list.  Patient reports backache and round ligament pain, ganglion cyst of wrist .  Contractions: Not present. Vag. Bleeding: None.  Movement: Absent. Denies leaking of fluid.   The following portions of the patient's history were reviewed and updated as appropriate: allergies, current medications, past family history, past medical history, past social history, past surgical history and problem list.   Objective:   Vitals:   04/14/22 1137 04/14/22 1139  BP: (!) 140/84 139/78  Pulse: 97 98  Weight: 189 lb 4.8 oz (85.9 kg)     Fetal Status: Fetal Heart Rate (bpm): 151   Movement: Absent     General:  Alert, oriented and cooperative. Patient is in no acute distress.  Skin: Skin is warm and dry. No rash noted.   Cardiovascular: Normal heart rate noted  Respiratory: Normal respiratory effort, no problems with respiration noted  Abdomen: Soft, gravid, appropriate for gestational age.  Pain/Pressure: Absent     Pelvic: Cervical exam deferred        Extremities: Normal range of motion.     Mental Status: Normal mood and affect. Normal behavior. Normal judgment and thought content.   Assessment and Plan:  Pregnancy: G2P1001 at [redacted]w[redacted]d 1. Supervision of high risk pregnancy, antepartum - AFP, Serum, Open Spina Bifida - prenatal vitamin w/FE, FA (NATACHEW) 29-1 MG CHEW  chewable tablet; Chew 1 tablet by mouth daily at 12 noon.  Dispense: 30 tablet; Refill: 11  2. History of pre-eclampsia in prior pregnancy, currently pregnant - On Procardia, did not take this morning  3. Alpha thalassemia silent carrier  4. History of C-section - Considering VBAC   5. History of gestational diabetes mellitus (GDM) in prior pregnancy, currently pregnant  6. Obesity affecting pregnancy in second trimester, unspecified obesity type  7. Round ligament pain - Discussed maternity belt for added support  8. Right sciatic nerve pain - Discussed options for managing this pain that is safe in pregnancy   9. [redacted] weeks gestation of pregnancy  Preterm labor symptoms and general obstetric precautions including but not limited to vaginal bleeding, contractions, leaking of fluid and fetal movement were reviewed in detail with the patient. Please refer to After Visit Summary for other counseling recommendations.   Return in about 4 weeks (around 05/12/2022) for LOB, any provider, In-Person.  Future Appointments  Date Time Provider [place]  05/08/2022  9:00 AM CENTERING PROVIDER [facility]  05/08/2022  1:30 PM [facility] NURSE [facility]  05/08/2022  1:45 PM [facility] US5 [facility]  06/05/2022  9:00 AM CENTERING PROVIDER [facility]  07/03/2022  9:00 AM CENTERING PROVIDER [facility]  07/17/2022  9:00 AM CENTERING PROVIDER [facility]  07/31/2022  9:00 AM CENTERING PROVIDER [facility]  08/14/2022  9:00 AM CENTERING PROVIDER [facility]  08/28/2022  9:00 AM CENTERING PROVIDER [facility]  09/11/2022  9:00 AM CENTERING PROVIDER [facility]  09/25/2022  9:00 AM CENTERING PROVIDER [facility]  10/09/2022  9:00 AM CENTERING PROVIDER [facility]  [facility]    Kerry Hough, PA-C

## 2022-04-16 LAB — AFP, SERUM, OPEN SPINA BIFIDA
AFP MoM: 1.07
AFP Value: 30 ng/mL
Gest. Age on Collection Date: 15 weeks
Maternal Age At EDD: 21.4 yr
OSBR Risk 1 IN: 10000
Test Results:: NEGATIVE
Weight: 189 [lb_av]

## 2022-04-17 MED ORDER — IPRATROPIUM-ALBUTEROL 20-100 MCG/ACT IN AERS: 1.0000 | INHALATION_SPRAY | Freq: Four times a day (QID) | RESPIRATORY_TRACT | 0 refills | Status: DC | PRN

## 2022-05-08 ENCOUNTER — Ambulatory Visit: Payer: Medicaid Other

## 2022-05-08 ENCOUNTER — Ambulatory Visit (HOSPITAL_BASED_OUTPATIENT_CLINIC_OR_DEPARTMENT_OTHER): Payer: Medicaid Other

## 2022-05-08 ENCOUNTER — Other Ambulatory Visit: Payer: Self-pay | Admitting: *Deleted

## 2022-05-08 ENCOUNTER — Ambulatory Visit: Payer: Medicaid Other | Attending: Obstetrics and Gynecology | Admitting: *Deleted

## 2022-05-08 ENCOUNTER — Ambulatory Visit (INDEPENDENT_AMBULATORY_CARE_PROVIDER_SITE_OTHER): Payer: Medicaid Other | Admitting: Advanced Practice Midwife

## 2022-05-08 VITALS — BP 124/60 | HR 82

## 2022-05-08 VITALS — Wt 190.0 lb

## 2022-05-08 DIAGNOSIS — O34219 Maternal care for unspecified type scar from previous cesarean delivery: Secondary | ICD-10-CM

## 2022-05-08 DIAGNOSIS — O35BXX0 Maternal care for other (suspected) fetal abnormality and damage, fetal cardiac anomalies, not applicable or unspecified: Secondary | ICD-10-CM | POA: Diagnosis not present

## 2022-05-08 DIAGNOSIS — D563 Thalassemia minor: Secondary | ICD-10-CM | POA: Diagnosis not present

## 2022-05-08 DIAGNOSIS — Z8632 Personal history of gestational diabetes: Secondary | ICD-10-CM

## 2022-05-08 DIAGNOSIS — O0990 Supervision of high risk pregnancy, unspecified, unspecified trimester: Secondary | ICD-10-CM

## 2022-05-08 DIAGNOSIS — O09292 Supervision of pregnancy with other poor reproductive or obstetric history, second trimester: Secondary | ICD-10-CM | POA: Insufficient documentation

## 2022-05-08 DIAGNOSIS — O10012 Pre-existing essential hypertension complicating pregnancy, second trimester: Secondary | ICD-10-CM | POA: Diagnosis not present

## 2022-05-08 DIAGNOSIS — Z98891 History of uterine scar from previous surgery: Secondary | ICD-10-CM | POA: Diagnosis not present

## 2022-05-08 DIAGNOSIS — E669 Obesity, unspecified: Secondary | ICD-10-CM | POA: Diagnosis not present

## 2022-05-08 DIAGNOSIS — Z3A19 19 weeks gestation of pregnancy: Secondary | ICD-10-CM | POA: Diagnosis not present

## 2022-05-08 DIAGNOSIS — O09899 Supervision of other high risk pregnancies, unspecified trimester: Secondary | ICD-10-CM

## 2022-05-08 DIAGNOSIS — O99210 Obesity complicating pregnancy, unspecified trimester: Secondary | ICD-10-CM

## 2022-05-08 DIAGNOSIS — O99212 Obesity complicating pregnancy, second trimester: Secondary | ICD-10-CM

## 2022-05-08 DIAGNOSIS — O10919 Unspecified pre-existing hypertension complicating pregnancy, unspecified trimester: Secondary | ICD-10-CM

## 2022-05-08 DIAGNOSIS — O0992 Supervision of high risk pregnancy, unspecified, second trimester: Secondary | ICD-10-CM | POA: Diagnosis not present

## 2022-05-08 DIAGNOSIS — O09299 Supervision of pregnancy with other poor reproductive or obstetric history, unspecified trimester: Secondary | ICD-10-CM

## 2022-05-08 DIAGNOSIS — I10 Essential (primary) hypertension: Secondary | ICD-10-CM

## 2022-05-08 DIAGNOSIS — Z363 Encounter for antenatal screening for malformations: Secondary | ICD-10-CM | POA: Insufficient documentation

## 2022-05-08 DIAGNOSIS — O209 Hemorrhage in early pregnancy, unspecified: Secondary | ICD-10-CM

## 2022-05-09 ENCOUNTER — Encounter (HOSPITAL_BASED_OUTPATIENT_CLINIC_OR_DEPARTMENT_OTHER): Payer: Self-pay | Admitting: Advanced Practice Midwife

## 2022-05-09 DIAGNOSIS — I10 Essential (primary) hypertension: Secondary | ICD-10-CM | POA: Insufficient documentation

## 2022-05-09 MED ORDER — ASPIRIN 81 MG PO TBEC: 162.0000 mg | DELAYED_RELEASE_TABLET | Freq: Every day | ORAL | 5 refills | Status: DC

## 2022-05-09 NOTE — Progress Notes (Signed)
       PRENATAL VISIT NOTE- Centering Pregnancy Cycle 12, Session # 1  Subjective:  Sabrina Dixon is a 22 y.o. G2P1001 at [redacted]w[redacted]d being seen today for ongoing prenatal care through Centering Pregnancy.  She is currently monitored for the following issues for this high-risk pregnancy and has Supervision of high risk pregnancy, antepartum; History of pre-eclampsia in prior pregnancy, currently pregnant; Obesity affecting pregnancy; History of gestational diabetes mellitus (GDM) in prior pregnancy, currently pregnant; History of C-section; Short interval between pregnancies affecting pregnancy, antepartum; Vaginal bleeding in pregnancy, first trimester; and Alpha thalassemia silent carrier on their problem list.  Patient reports no complaints.   . Vag. Bleeding: None.  Movement: Present. Denies leaking of fluid/ROM.   The following portions of the patient's history were reviewed and updated as appropriate: allergies, current medications, past family history, past medical history, past social history, past surgical history and problem list. Problem list updated.  Objective:   Vitals:   05/08/22 1201  Weight: 190 lb (86.2 kg)    Fetal Status: Fetal Heart Rate (bpm): 147   Movement: Present     General:  Alert, oriented and cooperative. Patient is in no acute distress.  Skin: Skin is warm and dry. No rash noted.   Cardiovascular: Normal heart rate noted  Respiratory: Normal respiratory effort, no problems with respiration noted  Abdomen: Soft, gravid, appropriate for gestational age.  Pain/Pressure: Absent     Pelvic: Cervical exam deferred        Extremities: Normal range of motion.  Edema: None  Mental Status: Normal mood and affect. Normal behavior. Normal judgment and thought content.   Assessment and Plan:  Pregnancy: G2P1001 at [redacted]w[redacted]d  1. Encounter for supervision of low-risk first pregnancy, antepartum   Centering Pregnancy, Session#1: Introduction to model of care. Group  determined rules for self-governance and closing phrase. Oriented group to space and mother's notebook.   Facilitated discussion today:  common discomforts, When to call practice  Mindfulness activity completed as well as introduction to deep breathing for childbirth preparation- Centering 3 Breaths  Fundal height and FHR appropriate today unless noted otherwise in plan of care. Patient to continue group care.    2. Alpha thalassemia silent carrier --Partner testing offered  3. History of C-section --Desires VBAC  4. Obesity affecting pregnancy in second trimester, unspecified obesity type --ASA daily  5. [redacted] weeks gestation of pregnancy   6. Chronic hypertension --On Nifedipine 90 daily --Add  ASA 162 mg     Preterm labor symptoms and general obstetric precautions including but not limited to vaginal bleeding, contractions, leaking of fluid and fetal movement were reviewed in detail with the patient. Please refer to After Visit Summary for other counseling recommendations.  No follow-ups on file.  Future Appointments  Date Time Provider Department Center  06/05/2022  9:00 AM CENTERING PROVIDER [facility]  06/12/2022 10:45 AM [facility] NURSE [facility]  06/12/2022 11:00 AM [facility] US1 [facility]  07/03/2022  9:00 AM CENTERING PROVIDER [facility]  07/17/2022  9:00 AM CENTERING PROVIDER [facility]  07/31/2022  9:00 AM CENTERING PROVIDER [facility]  08/14/2022  9:00 AM CENTERING PROVIDER [facility]  08/28/2022  9:00 AM CENTERING PROVIDER [facility]  09/11/2022  9:00 AM CENTERING PROVIDER [facility]  09/25/2022  9:00 AM CENTERING PROVIDER [facility]  10/09/2022  9:00 AM CENTERING PROVIDER [facility]    Sharen Counter, CNM

## 2022-05-10 ENCOUNTER — Encounter: Payer: Self-pay | Admitting: *Deleted

## 2022-05-15 ENCOUNTER — Other Ambulatory Visit: Payer: Self-pay | Admitting: Obstetrics & Gynecology

## 2022-05-15 DIAGNOSIS — J4520 Mild intermittent asthma, uncomplicated: Secondary | ICD-10-CM

## 2022-05-25 ENCOUNTER — Other Ambulatory Visit: Payer: Self-pay | Admitting: Lactation Services

## 2022-05-25 DIAGNOSIS — J4520 Mild intermittent asthma, uncomplicated: Secondary | ICD-10-CM

## 2022-05-25 MED ORDER — IPRATROPIUM-ALBUTEROL 20-100 MCG/ACT IN AERS: 1.0000 | INHALATION_SPRAY | Freq: Four times a day (QID) | RESPIRATORY_TRACT | 6 refills | Status: DC | PRN

## 2022-06-04 NOTE — Progress Notes (Unsigned)
       PRENATAL VISIT NOTE- Centering Pregnancy Cycle {NUMBERS:20191}, Session # {NUMBERS:20191}  Subjective:  Sabrina Dixon is a 22 y.o. G2P1001 at [redacted]w[redacted]d being seen today for ongoing prenatal care through Centering Pregnancy.  She is currently monitored for the following issues for this {Blank single:19197::"high-risk","low-risk"} pregnancy and has Supervision of high-risk pregnancy; History of pre-eclampsia in prior pregnancy, currently pregnant; Obesity affecting pregnancy; History of gestational diabetes mellitus (GDM) in prior pregnancy, currently pregnant; History of C-section; Short interval between pregnancies affecting pregnancy, antepartum; Vaginal bleeding in pregnancy, first trimester; Alpha thalassemia silent carrier; and Chronic hypertension on their problem list.  Patient reports {sx:14538}.   .  .   . ***Denies leaking of fluid/ROM.   The following portions of the patient's history were reviewed and updated as appropriate: allergies, current medications, past family history, past medical history, past social history, past surgical history and problem list. Problem list updated.  Objective:  There were no vitals filed for this visit.  Fetal Status:           General:  Alert, oriented and cooperative. Patient is in no acute distress.  Skin: Skin is warm and dry. No rash noted.   Cardiovascular: Normal heart rate noted  Respiratory: Normal respiratory effort, no problems with respiration noted  Abdomen: Soft, gravid, appropriate for gestational age.        Pelvic: {Blank single:19197::"Cervical exam performed","Cervical exam deferred"}        Extremities: Normal range of motion.     Mental Status: Normal mood and affect. Normal behavior. Normal judgment and thought content.   Assessment and Plan:  Pregnancy: G2P1001 at [redacted]w[redacted]d  There are no diagnoses linked to this encounter.  *** add [facility] dot phrase for session   {Blank single:19197::"Term","Preterm"} labor symptoms  and general obstetric precautions including but not limited to vaginal bleeding, contractions, leaking of fluid and fetal movement were reviewed in detail with the patient. Please refer to After Visit Summary for other counseling recommendations.  No follow-ups on file.  Future Appointments  Date Time Provider Department Center  06/05/2022  9:00 AM CENTERING PROVIDER [facility]  06/12/2022 10:45 AM [facility] NURSE [facility]  06/12/2022 11:00 AM [facility] US1 [facility]  07/03/2022  9:00 AM CENTERING PROVIDER [facility]  07/17/2022  9:00 AM CENTERING PROVIDER [facility]  07/31/2022  9:00 AM CENTERING PROVIDER [facility]  08/14/2022  9:00 AM CENTERING PROVIDER [facility]  08/28/2022  9:00 AM CENTERING PROVIDER [facility]  09/11/2022  9:00 AM CENTERING PROVIDER [facility]  09/25/2022  9:00 AM CENTERING PROVIDER [facility]  10/09/2022  9:00 AM CENTERING PROVIDER [facility]    Sharen Counter, CNM

## 2022-06-05 ENCOUNTER — Ambulatory Visit: Payer: Medicaid Other | Admitting: Advanced Practice Midwife

## 2022-06-05 VITALS — BP 123/74 | HR 112 | Wt 193.8 lb

## 2022-06-05 DIAGNOSIS — Z8632 Personal history of gestational diabetes: Secondary | ICD-10-CM

## 2022-06-05 DIAGNOSIS — O0990 Supervision of high risk pregnancy, unspecified, unspecified trimester: Secondary | ICD-10-CM

## 2022-06-05 DIAGNOSIS — I10 Essential (primary) hypertension: Secondary | ICD-10-CM

## 2022-06-05 DIAGNOSIS — Z3A22 22 weeks gestation of pregnancy: Secondary | ICD-10-CM

## 2022-06-05 DIAGNOSIS — O0992 Supervision of high risk pregnancy, unspecified, second trimester: Secondary | ICD-10-CM

## 2022-06-05 DIAGNOSIS — O09299 Supervision of pregnancy with other poor reproductive or obstetric history, unspecified trimester: Secondary | ICD-10-CM

## 2022-06-05 DIAGNOSIS — O09292 Supervision of pregnancy with other poor reproductive or obstetric history, second trimester: Secondary | ICD-10-CM

## 2022-06-05 DIAGNOSIS — Z98891 History of uterine scar from previous surgery: Secondary | ICD-10-CM

## 2022-06-06 ENCOUNTER — Encounter (HOSPITAL_BASED_OUTPATIENT_CLINIC_OR_DEPARTMENT_OTHER): Payer: Self-pay | Admitting: Advanced Practice Midwife

## 2022-06-06 DIAGNOSIS — R7309 Other abnormal glucose: Secondary | ICD-10-CM | POA: Insufficient documentation

## 2022-06-06 LAB — COMPREHENSIVE METABOLIC PANEL
ALT: 8 IU/L (ref 0–32)
AST: 11 IU/L (ref 0–40)
Albumin/Globulin Ratio: 1.5 (ref 1.2–2.2)
Albumin: 3.8 g/dL — ABNORMAL LOW (ref 4.0–5.0)
Alkaline Phosphatase: 111 IU/L (ref 44–121)
BUN/Creatinine Ratio: 14 (ref 9–23)
BUN: 7 mg/dL (ref 6–20)
Bilirubin Total: 0.3 mg/dL (ref 0.0–1.2)
CO2: 17 mmol/L — ABNORMAL LOW (ref 20–29)
Calcium: 9 mg/dL (ref 8.7–10.2)
Chloride: 105 mmol/L (ref 96–106)
Creatinine, Ser: 0.5 mg/dL — ABNORMAL LOW (ref 0.57–1.00)
Globulin, Total: 2.6 g/dL (ref 1.5–4.5)
Glucose: 105 mg/dL — ABNORMAL HIGH (ref 70–99)
Potassium: 4.6 mmol/L (ref 3.5–5.2)
Sodium: 138 mmol/L (ref 134–144)
Total Protein: 6.4 g/dL (ref 6.0–8.5)
eGFR: 137 mL/min/{1.73_m2} (ref >59)

## 2022-06-06 LAB — HEMOGLOBIN A1C
Est. average glucose Bld gHb Est-mCnc: 140 mg/dL
Hgb A1c MFr Bld: 6.5 % — ABNORMAL HIGH (ref 4.8–5.6)

## 2022-06-06 LAB — PROTEIN / CREATININE RATIO, URINE
Creatinine, Urine: 108.7 mg/dL
Protein, Ur: 12.4 mg/dL
Protein/Creat Ratio: 114 mg/g creat (ref 0–200)

## 2022-06-09 ENCOUNTER — Encounter: Payer: Self-pay | Admitting: *Deleted

## 2022-06-12 ENCOUNTER — Ambulatory Visit: Payer: Medicaid Other | Admitting: *Deleted

## 2022-06-12 ENCOUNTER — Encounter: Payer: Self-pay | Admitting: Obstetrics & Gynecology

## 2022-06-12 ENCOUNTER — Other Ambulatory Visit: Payer: Self-pay | Admitting: *Deleted

## 2022-06-12 ENCOUNTER — Ambulatory Visit: Payer: Medicaid Other | Attending: Obstetrics and Gynecology

## 2022-06-12 VITALS — BP 138/67 | HR 83

## 2022-06-12 DIAGNOSIS — O35BXX0 Maternal care for other (suspected) fetal abnormality and damage, fetal cardiac anomalies, not applicable or unspecified: Secondary | ICD-10-CM | POA: Diagnosis not present

## 2022-06-12 DIAGNOSIS — O10919 Unspecified pre-existing hypertension complicating pregnancy, unspecified trimester: Secondary | ICD-10-CM

## 2022-06-12 DIAGNOSIS — O99212 Obesity complicating pregnancy, second trimester: Secondary | ICD-10-CM

## 2022-06-12 DIAGNOSIS — Z362 Encounter for other antenatal screening follow-up: Secondary | ICD-10-CM

## 2022-06-12 DIAGNOSIS — Z148 Genetic carrier of other disease: Secondary | ICD-10-CM

## 2022-06-12 DIAGNOSIS — O10012 Pre-existing essential hypertension complicating pregnancy, second trimester: Secondary | ICD-10-CM

## 2022-06-12 DIAGNOSIS — O10912 Unspecified pre-existing hypertension complicating pregnancy, second trimester: Secondary | ICD-10-CM

## 2022-06-12 DIAGNOSIS — O285 Abnormal chromosomal and genetic finding on antenatal screening of mother: Secondary | ICD-10-CM

## 2022-06-12 DIAGNOSIS — O34219 Maternal care for unspecified type scar from previous cesarean delivery: Secondary | ICD-10-CM

## 2022-06-12 DIAGNOSIS — O09292 Supervision of pregnancy with other poor reproductive or obstetric history, second trimester: Secondary | ICD-10-CM

## 2022-06-12 DIAGNOSIS — Z3A24 24 weeks gestation of pregnancy: Secondary | ICD-10-CM

## 2022-06-12 DIAGNOSIS — E669 Obesity, unspecified: Secondary | ICD-10-CM

## 2022-06-12 DIAGNOSIS — O24410 Gestational diabetes mellitus in pregnancy, diet controlled: Secondary | ICD-10-CM

## 2022-06-20 ENCOUNTER — Telehealth: Payer: Self-pay

## 2022-06-20 NOTE — Telephone Encounter (Addendum)
-----   Message from Hurshel Party, CNM sent at 06/06/2022  7:34 PM EDT ----- A1C is quite elevated. I still want her to do the 2 hour for diagnosis. I sent her a message so she is looking for the 2 hour to be scheduled before the next Centering.  Thank you!  Pt informed results and pt agreed to appt schedule on 06/23/22 for early 2 hr.  Danton Palmateer,RN  06/20/22

## 2022-06-23 ENCOUNTER — Encounter: Payer: Self-pay | Admitting: Obstetrics & Gynecology

## 2022-06-23 ENCOUNTER — Other Ambulatory Visit: Payer: Medicaid Other

## 2022-06-23 ENCOUNTER — Other Ambulatory Visit: Payer: Self-pay | Admitting: General Practice

## 2022-06-23 DIAGNOSIS — O0992 Supervision of high risk pregnancy, unspecified, second trimester: Secondary | ICD-10-CM

## 2022-06-24 LAB — GLUCOSE TOLERANCE, 2 HOURS W/ 1HR
Glucose, 1 hour: 204 mg/dL — ABNORMAL HIGH (ref 70–179)
Glucose, 2 hour: 154 mg/dL — ABNORMAL HIGH (ref 70–152)
Glucose, Fasting: 117 mg/dL — ABNORMAL HIGH (ref 70–91)

## 2022-06-24 LAB — CBC
Hematocrit: 30 % — ABNORMAL LOW (ref 34.0–46.6)
Hemoglobin: 9 g/dL — ABNORMAL LOW (ref 11.1–15.9)
MCH: 20.8 pg — ABNORMAL LOW (ref 26.6–33.0)
MCHC: 30 g/dL — ABNORMAL LOW (ref 31.5–35.7)
MCV: 69 fL — ABNORMAL LOW (ref 79–97)
Platelets: 214 10*3/uL (ref 150–450)
RBC: 4.33 x10E6/uL (ref 3.77–5.28)
RDW: 15.5 % — ABNORMAL HIGH (ref 11.7–15.4)
WBC: 7.2 10*3/uL (ref 3.4–10.8)

## 2022-06-24 LAB — HIV ANTIBODY (ROUTINE TESTING W REFLEX): HIV Screen 4th Generation wRfx: NONREACTIVE

## 2022-06-24 LAB — RPR: RPR Ser Ql: NONREACTIVE

## 2022-06-25 ENCOUNTER — Other Ambulatory Visit: Payer: Self-pay | Admitting: Family Medicine

## 2022-06-25 DIAGNOSIS — O1413 Severe pre-eclampsia, third trimester: Secondary | ICD-10-CM

## 2022-06-27 ENCOUNTER — Other Ambulatory Visit (HOSPITAL_BASED_OUTPATIENT_CLINIC_OR_DEPARTMENT_OTHER): Payer: Self-pay | Admitting: Advanced Practice Midwife

## 2022-06-27 MED ORDER — FERROUS SULFATE 324 (65 FE) MG PO TBEC: 1.0000 | DELAYED_RELEASE_TABLET | ORAL | 5 refills | Status: DC

## 2022-06-29 ENCOUNTER — Telehealth: Payer: Self-pay

## 2022-06-29 NOTE — Telephone Encounter (Addendum)
-----   Message from Hurshel Party, CNM sent at 06/27/2022 12:59 PM EDT ----- Centering pt failed early 2 hour and needs diabetic education and meter.  I sent her a Mychart message letting her know about class and meter.  Thank you.    Left message for pt to return call about results and f/u.   Leonette Nutting  06/29/22

## 2022-07-03 ENCOUNTER — Other Ambulatory Visit: Payer: Self-pay

## 2022-07-03 ENCOUNTER — Ambulatory Visit: Payer: Medicaid Other | Admitting: Advanced Practice Midwife

## 2022-07-03 VITALS — BP 127/76 | HR 80

## 2022-07-03 DIAGNOSIS — Z3A27 27 weeks gestation of pregnancy: Secondary | ICD-10-CM

## 2022-07-03 DIAGNOSIS — Z98891 History of uterine scar from previous surgery: Secondary | ICD-10-CM

## 2022-07-03 DIAGNOSIS — O0992 Supervision of high risk pregnancy, unspecified, second trimester: Secondary | ICD-10-CM

## 2022-07-03 DIAGNOSIS — O1413 Severe pre-eclampsia, third trimester: Secondary | ICD-10-CM

## 2022-07-03 DIAGNOSIS — I10 Essential (primary) hypertension: Secondary | ICD-10-CM

## 2022-07-03 DIAGNOSIS — O24410 Gestational diabetes mellitus in pregnancy, diet controlled: Secondary | ICD-10-CM

## 2022-07-03 MED ORDER — ACCU-CHEK SOFTCLIX LANCETS MISC: 12 refills | Status: DC

## 2022-07-03 MED ORDER — NIFEDIPINE ER 90 MG PO TB24: 90.0000 mg | ORAL_TABLET | Freq: Every day | ORAL | 3 refills | Status: DC

## 2022-07-03 MED ORDER — ACCU-CHEK GUIDE W/DEVICE KIT: 1.0000 | PACK | 0 refills | Status: DC | PRN

## 2022-07-03 MED ORDER — ACCU-CHEK GUIDE VI STRP: ORAL_STRIP | 12 refills | Status: DC

## 2022-07-03 NOTE — Telephone Encounter (Signed)
Per chart review patient read provider message. Will also be addressed during Centering Pregnancy prenatal visit today.

## 2022-07-03 NOTE — Progress Notes (Signed)
PRENATAL VISIT NOTE- Centering Pregnancy Cycle 12, Session # 3  Subjective:  Sabrina Dixon is a 22 y.o. G3P1001 at [redacted]w[redacted]d being seen today for ongoing prenatal care through Centering Pregnancy.  She is currently monitored for the following issues for this high-risk pregnancy and has GDM (gestational diabetes mellitus); Supervision of high-risk pregnancy; History of pre-eclampsia in prior pregnancy, currently pregnant; Obesity affecting pregnancy; History of gestational diabetes mellitus (GDM) in prior pregnancy, currently pregnant; History of C-section; Short interval between pregnancies affecting pregnancy, antepartum; Alpha thalassemia silent carrier; Chronic hypertension; Elevated hemoglobin A1c; and Anemia affecting pregnancy in third trimester on their problem list.  Patient reports no complaints.   . Vag. Bleeding: None.  Movement: Present. Denies leaking of fluid/ROM.   The following portions of the patient's history were reviewed and updated as appropriate: allergies, current medications, past family history, past medical history, past social history, past surgical history and problem list. Problem list updated.  Objective:   Vitals:   07/03/22 1230  BP: 127/76  Pulse: 80    Fetal Status: Fetal Heart Rate (bpm): 135 Fundal Height: 27 cm Movement: Present     General:  Alert, oriented and cooperative. Patient is in no acute distress.  Skin: Skin is warm and dry. No rash noted.   Cardiovascular: Normal heart rate noted  Respiratory: Normal respiratory effort, no problems with respiration noted  Abdomen: Soft, gravid, appropriate for gestational age.  Pain/Pressure: Absent     Pelvic: Cervical exam deferred        Extremities: Normal range of motion.  Edema: None  Mental Status: Normal mood and affect. Normal behavior. Normal judgment and thought content.   Assessment and Plan:  Pregnancy: G3P1001 at [redacted]w[redacted]d  1. Supervision of high risk pregnancy in second  trimester --Anticipatory guidance about next visits/weeks of pregnancy given.    Centering Pregnancy, Session#3: Reviewed resources in [employer].   Facilitated discussion today:  Stress/Stress reduction, breastfeeding/infant nutrition, oral health, mindfulness activity completed as well as deep breathing/relaxation/centering  Fundal height and FHR appropriate today unless noted otherwise in plan. Patient to continue group care.   - HIV Antibody (routine testing w rflx) - RPR - CBC  2. Diet controlled gestational diabetes mellitus (GDM) in third trimester --Pt has not gotten meter or gone to class yet.   --Discussed with pt today, has never used a meter but her mother can help her --Rx for meter and schedule for diabetes class --Pt to send MyChart message to CNM weekly with glucose values  - Referral to Nutrition and Diabetes Services  3. Chronic hypertension --On Procardia 90 mg daily --BP wnl today   4. History of C-section --Desires VBAC, appt with MD during pregnancy for consent    Preterm labor symptoms and general obstetric precautions including but not limited to vaginal bleeding, contractions, leaking of fluid and fetal movement were reviewed in detail with the patient. Please refer to After Visit Summary for other counseling recommendations.  No follow-ups on file.  Future Appointments  Date Time Provider Department Center  07/17/2022  9:00 AM CENTERING PROVIDER [facility]  07/27/2022  8:55 AM Oakley Bing, MD [facility]  07/31/2022  9:00 AM CENTERING PROVIDER [facility]  08/08/2022 11:15 AM [facility] NURSE [facility]  08/08/2022 11:30 AM [facility] US2 [facility]  08/14/2022  9:00 AM CENTERING PROVIDER [facility]  08/15/2022  9:30 AM [facility] NURSE [facility]  08/15/2022  9:45 AM [facility]FC NST [facility]  08/22/2022  9:30  AM [facility] NURSE [facility]  08/22/2022  9:45 AM W[facility]-MFC NST [facility]  08/28/2022  9:00 AM CENTERING PROVIDER [facility]  08/29/2022  10:30 AM [facility] NURSE [facility]  08/29/2022 10:45 AM [facility] NST [facility]  09/11/2022  9:00 AM CENTERING PROVIDER [facility]  09/25/2022  9:00 AM CENTERING PROVIDER [facility]  10/09/2022  9:00 AM CENTERING PROVIDER [facility]    Sharen Counter, CNM

## 2022-07-03 NOTE — Telephone Encounter (Signed)
Received fax from pharmacy requesting refill on Nifedipine 90mg .   Maureen Ralphs RN on 07/03/22 at [phone]

## 2022-07-04 ENCOUNTER — Other Ambulatory Visit: Payer: Self-pay | Admitting: Advanced Practice Midwife

## 2022-07-04 DIAGNOSIS — O99013 Anemia complicating pregnancy, third trimester: Secondary | ICD-10-CM | POA: Insufficient documentation

## 2022-07-04 LAB — CBC
Hematocrit: 29.1 % — ABNORMAL LOW (ref 34.0–46.6)
Hemoglobin: 8.8 g/dL — ABNORMAL LOW (ref 11.1–15.9)
MCH: 20.8 pg — ABNORMAL LOW (ref 26.6–33.0)
MCHC: 30.2 g/dL — ABNORMAL LOW (ref 31.5–35.7)
MCV: 69 fL — ABNORMAL LOW (ref 79–97)
Platelets: 229 10*3/uL (ref 150–450)
RBC: 4.23 x10E6/uL (ref 3.77–5.28)
RDW: 15.3 % (ref 11.7–15.4)
WBC: 7.7 10*3/uL (ref 3.4–10.8)

## 2022-07-04 LAB — HIV ANTIBODY (ROUTINE TESTING W REFLEX): HIV Screen 4th Generation wRfx: NONREACTIVE

## 2022-07-04 LAB — RPR: RPR Ser Ql: NONREACTIVE

## 2022-07-04 NOTE — Progress Notes (Signed)
IV Venofer ordered for Hgb 8.8, anemia of pregnancy.

## 2022-07-06 ENCOUNTER — Telehealth: Payer: Self-pay | Admitting: Pharmacy Technician

## 2022-07-06 NOTE — Telephone Encounter (Signed)
Auth Submission: NO AUTH NEEDED Site of care: Site of care: [facility] Payer: [employer] Medication & CPT/J Code(s) submitted: Venofer (Iron Sucrose) J1756 Route of submission (phone, fax, portal):  Phone # Fax # Auth type: Buy/Bill Units/visits requested: X2 DOSES Reference number:  Approval from: 07/04/22 to 11/05/22

## 2022-07-10 ENCOUNTER — Ambulatory Visit: Payer: Medicaid Other | Attending: Obstetrics and Gynecology

## 2022-07-10 ENCOUNTER — Encounter: Payer: Self-pay | Admitting: Advanced Practice Midwife

## 2022-07-10 ENCOUNTER — Other Ambulatory Visit: Payer: Self-pay | Admitting: *Deleted

## 2022-07-10 ENCOUNTER — Ambulatory Visit: Payer: Medicaid Other | Admitting: *Deleted

## 2022-07-10 VITALS — BP 122/65 | HR 100

## 2022-07-10 DIAGNOSIS — O09293 Supervision of pregnancy with other poor reproductive or obstetric history, third trimester: Secondary | ICD-10-CM | POA: Diagnosis not present

## 2022-07-10 DIAGNOSIS — Z362 Encounter for other antenatal screening follow-up: Secondary | ICD-10-CM | POA: Diagnosis present

## 2022-07-10 DIAGNOSIS — O24410 Gestational diabetes mellitus in pregnancy, diet controlled: Secondary | ICD-10-CM

## 2022-07-10 DIAGNOSIS — O35BXX0 Maternal care for other (suspected) fetal abnormality and damage, fetal cardiac anomalies, not applicable or unspecified: Secondary | ICD-10-CM | POA: Diagnosis not present

## 2022-07-10 DIAGNOSIS — O99013 Anemia complicating pregnancy, third trimester: Secondary | ICD-10-CM | POA: Insufficient documentation

## 2022-07-10 DIAGNOSIS — O34219 Maternal care for unspecified type scar from previous cesarean delivery: Secondary | ICD-10-CM

## 2022-07-10 DIAGNOSIS — O10013 Pre-existing essential hypertension complicating pregnancy, third trimester: Secondary | ICD-10-CM | POA: Diagnosis not present

## 2022-07-10 DIAGNOSIS — O285 Abnormal chromosomal and genetic finding on antenatal screening of mother: Secondary | ICD-10-CM

## 2022-07-10 DIAGNOSIS — D649 Anemia, unspecified: Secondary | ICD-10-CM

## 2022-07-10 DIAGNOSIS — O10919 Unspecified pre-existing hypertension complicating pregnancy, unspecified trimester: Secondary | ICD-10-CM

## 2022-07-10 DIAGNOSIS — Z3A28 28 weeks gestation of pregnancy: Secondary | ICD-10-CM

## 2022-07-10 DIAGNOSIS — Z148 Genetic carrier of other disease: Secondary | ICD-10-CM

## 2022-07-11 ENCOUNTER — Encounter: Payer: Self-pay | Admitting: Registered"

## 2022-07-11 ENCOUNTER — Encounter: Payer: Medicaid Other | Attending: Advanced Practice Midwife | Admitting: Registered"

## 2022-07-11 ENCOUNTER — Ambulatory Visit (INDEPENDENT_AMBULATORY_CARE_PROVIDER_SITE_OTHER): Payer: Medicaid Other | Admitting: Registered"

## 2022-07-11 DIAGNOSIS — Z3A30 30 weeks gestation of pregnancy: Secondary | ICD-10-CM | POA: Insufficient documentation

## 2022-07-11 DIAGNOSIS — O0993 Supervision of high risk pregnancy, unspecified, third trimester: Secondary | ICD-10-CM | POA: Diagnosis present

## 2022-07-11 DIAGNOSIS — O24410 Gestational diabetes mellitus in pregnancy, diet controlled: Secondary | ICD-10-CM | POA: Insufficient documentation

## 2022-07-11 DIAGNOSIS — Z98891 History of uterine scar from previous surgery: Secondary | ICD-10-CM | POA: Insufficient documentation

## 2022-07-11 DIAGNOSIS — O09293 Supervision of pregnancy with other poor reproductive or obstetric history, third trimester: Secondary | ICD-10-CM | POA: Insufficient documentation

## 2022-07-11 DIAGNOSIS — O24419 Gestational diabetes mellitus in pregnancy, unspecified control: Secondary | ICD-10-CM

## 2022-07-11 DIAGNOSIS — Z3A28 28 weeks gestation of pregnancy: Secondary | ICD-10-CM

## 2022-07-11 NOTE — Progress Notes (Signed)
Patient was seen for Gestational Diabetes self-management on 07/11/22  Start time 1416 and End time 1513   Estimated due date: 10/02/22; [redacted]w[redacted]d  Next Ob visit on 07/17/22 with Centering at [facility]  Clinical: Medications: reviewed Medical History: GDM last year, anemia Labs: OGTT  Lab Results  Component Value Date   HGBA1C 6.5 (H) 06/05/2022    Blood glucose monitor given: Accu-chek Guide Me Lot #[id number] Exp: 07/18/23 CBG: 93 mg/dL   Dietary and Lifestyle History: Pt states she changed her diet back to what she did last time she had GDM.  Pt likes vegetables, usually cooks in butter because she doesn't like the mess. Pt states she uses cooking spray.  Pt states she doesn't eat yogurt, drinks silk soy milk with cereal [facility]), will eat cheese. Pt states she doesn't eat nuts, but likes sunflower seeds. Pt states doesn't eat much fish.   Pt states she is taking her iron prescription every other day and notices that she is eating less ice.  Pt states her dinner time varies, for example if she goes to bed at 8:30 will eat dinner at 8 pm.  Pt states with her last child she stopped breastfeeding at 3 months due to sore breast but determined BF longer this time.  Physical Activity: ADL works at [employer], Wed walks with daughter & dogs Stress: not assessed Sleep: 8:30 pm - 9 am  24 hr Recall:  First Meal: eggs, wheat bread, fruit, [place] bacon Snack: none Second meal: sesame chicken, lomein, water Snack: none Third meal: mcdonalds 2 cheese burger meal, water Snack: fruit or granola bars Beverages: water, juicy juice, 2c soy milk    NUTRITION INTERVENTION  Nutrition education (E-1) on the following topics:   Initial Follow-up  [x]  []  Definition of Gestational Diabetes [x]  []  Why dietary management is important in controlling blood glucose [x]  []  Effects each nutrient has on blood glucose levels [x]  []  Simple carbohydrates vs complex carbohydrates [x]  []  Fluid  intake [x]  []  Creating a balanced meal plan [x]  []  Carbohydrate counting  [x]  []  When to check blood glucose levels [x]  []  Proper blood glucose monitoring techniques [x]  []  Effect of stress and stress reduction techniques  [x]  []  Exercise effect on blood glucose levels, appropriate exercise during pregnancy [x]  []  Importance of limiting caffeine and abstaining from alcohol and smoking [x]  []  Medications used for blood sugar control during pregnancy [x]  []  Hypoglycemia and rule of 15 [x]  []  Postpartum self care  Patient instructed to monitor glucose levels: FBS: 60 - ? 95 mg/dL, 2 hour: ? 045 mg/dL  Patient received handouts: Nutrition Diabetes and Pregnancy Carbohydrate Counting List  Patient will be seen for follow-up as needed.

## 2022-07-11 NOTE — Patient Instructions (Signed)
Consider taking vitamin D3 1000 units with your soy milk and not at the same time as your iron supplement. The dose may change if your doctor does the Vit D lab test and it is low.

## 2022-07-16 NOTE — Progress Notes (Signed)
PRENATAL VISIT NOTE- Centering Pregnancy Cycle 12, Session # 4  Subjective:  Sabrina Dixon is a 22 y.o. G3P1001 at [redacted]w[redacted]d being seen today for ongoing prenatal care through Centering Pregnancy.  She is currently monitored for the following issues for this high-risk pregnancy and has GDM (gestational diabetes mellitus); Supervision of high-risk pregnancy; History of pre-eclampsia in prior pregnancy, currently pregnant; Obesity affecting pregnancy; History of gestational diabetes mellitus (GDM) in prior pregnancy, currently pregnant; History of C-section; Short interval between pregnancies affecting pregnancy, antepartum; Alpha thalassemia silent carrier; Chronic hypertension; Elevated hemoglobin A1c; and Anemia affecting pregnancy in third trimester on their problem list.  Patient reports no complaints.  Contractions: Not present. Vag. Bleeding: None.  Movement: Present. Denies leaking of fluid/ROM.   The following portions of the patient's history were reviewed and updated as appropriate: allergies, current medications, past family history, past medical history, past social history, past surgical history and problem list. Problem list updated.  Objective:   Vitals:   07/17/22 0919  BP: 123/73  Pulse: (!) 103  Weight: 200 lb 3.2 oz (90.8 kg)    Fetal Status: Fetal Heart Rate (bpm): 147 Fundal Height: 29 cm Movement: Present     General:  Alert, oriented and cooperative. Patient is in no acute distress.  Skin: Skin is warm and dry. No rash noted.   Cardiovascular: Normal heart rate noted  Respiratory: Normal respiratory effort, no problems with respiration noted  Abdomen: Soft, gravid, appropriate for gestational age.  Pain/Pressure: Absent     Pelvic: Cervical exam deferred        Extremities: Normal range of motion.  Edema: None  Mental Status: Normal mood and affect. Normal behavior. Normal judgment and thought content.   Assessment and Plan:  Pregnancy: G3P1001 at  [redacted]w[redacted]d  1. Supervision of high risk pregnancy in second trimester --Anticipatory guidance about next visits/weeks of pregnancy given.   Centering Pregnancy, Session#4: Reviewed resources in [employer].    Facilitated discussion today:  Family traditions/history, Family planning, breastfeeding  Fundal height and FHR appropriate today unless noted otherwise in plan. Patient to continue group care.    2. Gestational diabetes mellitus (GDM), antepartum, gestational diabetes method of control unspecified --Reviewed glucose log.  Fasting values, 1 out of 5 elevated at 96, 2 out of 15 PP elevated, one at 126, and one outlier at 200.   --Overall good glucose control, continue with diet  3. Anemia affecting pregnancy in third trimester --IV iron ordered  4. Chronic hypertension --On Procardia, BP wnl, no s/sx of PEC  5. History of C-section --Desires VBAC, appt with MD to sign VBAC consent  6. [redacted] weeks gestation of pregnancy     Preterm labor symptoms and general obstetric precautions including but not limited to vaginal bleeding, contractions, leaking of fluid and fetal movement were reviewed in detail with the patient. Please refer to After Visit Summary for other counseling recommendations.  Return for Centering Sessions as scheduled.  Future Appointments  Date Time Provider Department Center  07/27/2022  8:55 AM [place] Bing, MD [facility]  07/31/2022  9:00 AM CENTERING PROVIDER [facility]  08/08/2022 11:15 AM [facility] NURSE [facility]  08/08/2022 11:30 AM [facility] US2 [facility]  08/14/2022  9:00 AM CENTERING PROVIDER [facility]  08/15/2022  9:30 AM [facility] NURSE [facility]  08/15/2022  9:45 AM [facility] NST [facility]  08/22/2022  9:30 AM [facility] NURSE [facility]  08/22/2022  9:45 AM [facility] NST [facility]  08/28/2022  9:00 AM CENTERING PROVIDER [facility]  08/29/2022 10:30 AM [facility] NURSE [facility]  08/29/2022 10:45 AM [facility] NST [facility]  09/11/2022  9:00 AM  CENTERING PROVIDER [facility]  09/25/2022  9:00 AM CENTERING PROVIDER [facility]  10/09/2022  9:00 AM CENTERING PROVIDER [facility]    Sharen Counter, CNM

## 2022-07-17 ENCOUNTER — Ambulatory Visit (INDEPENDENT_AMBULATORY_CARE_PROVIDER_SITE_OTHER): Payer: Medicaid Other | Admitting: Advanced Practice Midwife

## 2022-07-17 VITALS — BP 123/73 | HR 103 | Wt 200.2 lb

## 2022-07-17 DIAGNOSIS — I10 Essential (primary) hypertension: Secondary | ICD-10-CM

## 2022-07-17 DIAGNOSIS — O24419 Gestational diabetes mellitus in pregnancy, unspecified control: Secondary | ICD-10-CM

## 2022-07-17 DIAGNOSIS — O0993 Supervision of high risk pregnancy, unspecified, third trimester: Secondary | ICD-10-CM

## 2022-07-17 DIAGNOSIS — Z3A28 28 weeks gestation of pregnancy: Secondary | ICD-10-CM

## 2022-07-17 DIAGNOSIS — Z98891 History of uterine scar from previous surgery: Secondary | ICD-10-CM

## 2022-07-17 DIAGNOSIS — O99013 Anemia complicating pregnancy, third trimester: Secondary | ICD-10-CM

## 2022-07-17 DIAGNOSIS — O0992 Supervision of high risk pregnancy, unspecified, second trimester: Secondary | ICD-10-CM

## 2022-07-18 ENCOUNTER — Encounter: Payer: Self-pay | Admitting: Advanced Practice Midwife

## 2022-07-27 ENCOUNTER — Encounter: Payer: Medicaid Other | Admitting: Obstetrics and Gynecology

## 2022-07-28 ENCOUNTER — Other Ambulatory Visit: Payer: Self-pay

## 2022-07-28 ENCOUNTER — Ambulatory Visit (INDEPENDENT_AMBULATORY_CARE_PROVIDER_SITE_OTHER): Payer: Medicaid Other | Admitting: Family Medicine

## 2022-07-28 VITALS — BP 122/81 | HR 120 | Wt 195.3 lb

## 2022-07-28 DIAGNOSIS — Z3A30 30 weeks gestation of pregnancy: Secondary | ICD-10-CM

## 2022-07-28 DIAGNOSIS — Z98891 History of uterine scar from previous surgery: Secondary | ICD-10-CM

## 2022-07-28 DIAGNOSIS — O09299 Supervision of pregnancy with other poor reproductive or obstetric history, unspecified trimester: Secondary | ICD-10-CM

## 2022-07-28 DIAGNOSIS — O0993 Supervision of high risk pregnancy, unspecified, third trimester: Secondary | ICD-10-CM

## 2022-07-28 DIAGNOSIS — O24419 Gestational diabetes mellitus in pregnancy, unspecified control: Secondary | ICD-10-CM | POA: Insufficient documentation

## 2022-07-28 DIAGNOSIS — O24410 Gestational diabetes mellitus in pregnancy, diet controlled: Secondary | ICD-10-CM

## 2022-07-28 NOTE — Progress Notes (Signed)
Patient reports vomiting and intermittent contractions yesterday between 4pm and 8pm. She rates pain 7/10. Denies any VOB or LOF.   Dalilah did not bring glucose log for review   Tdap vaccine was offered to patient and she stated " No, I will get it at my centering appointment"   Dontae Minerva , CCMA

## 2022-07-28 NOTE — Progress Notes (Signed)
   PRENATAL VISIT NOTE  Subjective:  Sabrina Dixon is a 22 y.o. G3P1001 at [redacted]w[redacted]d being seen today for ongoing prenatal care.  She is currently monitored for the following issues for this high-risk pregnancy and has GDM (gestational diabetes mellitus); Supervision of high-risk pregnancy; History of pre-eclampsia in prior pregnancy, currently pregnant; Obesity affecting pregnancy; History of gestational diabetes mellitus (GDM) in prior pregnancy, currently pregnant; History of C-section; Short interval between pregnancies affecting pregnancy, antepartum; Alpha thalassemia silent carrier; Chronic hypertension; Elevated hemoglobin A1c; and Anemia affecting pregnancy in third trimester on their problem list.  Patient reports no complaints.  Contractions: Irritability.  .  Movement: Present. Denies leaking of fluid.   The following portions of the patient's history were reviewed and updated as appropriate: allergies, current medications, past family history, past medical history, past social history, past surgical history and problem list.   Objective:   Vitals:   07/28/22 1121  BP: 122/81  Pulse: (!) 120  Weight: 195 lb 4.8 oz (88.6 kg)    Fetal Status: Fetal Heart Rate (bpm): 152   Movement: Present     General:  Alert, oriented and cooperative. Patient is in no acute distress.  Skin: Skin is warm and dry. No rash noted.   Cardiovascular: Normal heart rate noted  Respiratory: Normal respiratory effort, no problems with respiration noted  Abdomen: Soft, gravid, appropriate for gestational age.  Pain/Pressure: Present     Pelvic: Cervical exam deferred        Extremities: Normal range of motion.     Mental Status: Normal mood and affect. Normal behavior. Normal judgment and thought content.   Assessment and Plan:  Pregnancy: G3P1001 at [redacted]w[redacted]d 1. Supervision of high risk pregnancy in third trimester Continue routine prenatal care.  2. History of pre-eclampsia in prior pregnancy, currently  pregnant   3. History of C-section Discussed risks of TOLAC vs. RCS and she is consdering--  4. Diet controlled gestational diabetes mellitus (GDM) in third trimester No book--see note from [place]   Preterm labor symptoms and general obstetric precautions including but not limited to vaginal bleeding, contractions, leaking of fluid and fetal movement were reviewed in detail with the patient. Please refer to After Visit Summary for other counseling recommendations.   Return in 1 week (on 08/04/2022).  Future Appointments  Date Time Provider Department Center  07/31/2022  9:00 AM CENTERING PROVIDER [facility]  08/08/2022 11:15 AM [facility] NURSE [facility]  08/08/2022 11:30 AM [facility] US2 [facility]  08/14/2022  9:00 AM CENTERING PROVIDER [facility]  08/15/2022  9:30 AM [facility] NURSE [facility]  08/15/2022  9:45 AM [facility] NST [facility]  08/22/2022  9:30 AM [facility] NURSE [facility]  08/22/2022  9:45 AM [facility] NST [facility]  08/28/2022  9:00 AM CENTERING PROVIDER [facility]  08/29/2022 10:30 AM [facility] NURSE [facility]  08/29/2022 10:45 AM [facility] NST [facility]  09/11/2022  9:00 AM CENTERING PROVIDER [facility]  09/25/2022  9:00 AM CENTERING PROVIDER [facility]  10/09/2022  9:00 AM CENTERING PROVIDER [facility]    Reva Bores, MD

## 2022-07-28 NOTE — Patient Instructions (Signed)
Following an appropriate diet and keeping your blood sugar under control is the most important thing to do for your health and that of your unborn baby.  Please check your blood sugar 4 times daily.  Please keep accurate BS logs and bring them with you to every visit.  Please bring your meter also.  Goals for Blood sugar should be: 1. Fasting (first thing in the morning before eating) should be less than 90.   2.  2 hours after meals should be less than 120.  Please eat 3 meals and 3 snacks.  Include protein (meat, dairy-cheese, eggs, nuts) with all meals.  Be mindful that carbohydrates increase your blood sugar.  Not just sweet food (cookies, cake, donuts, fruit, juice, soda) but also bread, pasta, rice, and potatoes.  You have to limit how many carbs you are eating.  Adding exercise, as little as 30 minutes a day can decrease your blood sugar.  

## 2022-07-31 ENCOUNTER — Other Ambulatory Visit: Payer: Self-pay | Admitting: Advanced Practice Midwife

## 2022-07-31 ENCOUNTER — Ambulatory Visit (INDEPENDENT_AMBULATORY_CARE_PROVIDER_SITE_OTHER): Payer: Medicaid Other

## 2022-07-31 VITALS — BP 112/75 | HR 86 | Wt 198.8 lb

## 2022-07-31 DIAGNOSIS — Z98891 History of uterine scar from previous surgery: Secondary | ICD-10-CM

## 2022-07-31 DIAGNOSIS — O24410 Gestational diabetes mellitus in pregnancy, diet controlled: Secondary | ICD-10-CM

## 2022-07-31 DIAGNOSIS — Z3A31 31 weeks gestation of pregnancy: Secondary | ICD-10-CM | POA: Diagnosis not present

## 2022-07-31 DIAGNOSIS — O0993 Supervision of high risk pregnancy, unspecified, third trimester: Secondary | ICD-10-CM

## 2022-07-31 DIAGNOSIS — Z23 Encounter for immunization: Secondary | ICD-10-CM | POA: Diagnosis not present

## 2022-07-31 DIAGNOSIS — Z3483 Encounter for supervision of other normal pregnancy, third trimester: Secondary | ICD-10-CM

## 2022-07-31 DIAGNOSIS — O09293 Supervision of pregnancy with other poor reproductive or obstetric history, third trimester: Secondary | ICD-10-CM

## 2022-07-31 DIAGNOSIS — I10 Essential (primary) hypertension: Secondary | ICD-10-CM

## 2022-07-31 DIAGNOSIS — O09299 Supervision of pregnancy with other poor reproductive or obstetric history, unspecified trimester: Secondary | ICD-10-CM

## 2022-07-31 NOTE — Progress Notes (Signed)
PRENATAL VISIT NOTE- Centering Pregnancy Cycle 12, Session # 5  Subjective:  Sabrina Dixon is a 22 y.o. G3P1001 at [redacted]w[redacted]d being seen today for ongoing prenatal care through Centering Pregnancy.  She is currently monitored for the following issues for this high-risk pregnancy and has GDM (gestational diabetes mellitus); Supervision of high-risk pregnancy; History of pre-eclampsia in prior pregnancy, currently pregnant; Obesity affecting pregnancy; History of gestational diabetes mellitus (GDM) in prior pregnancy, currently pregnant; History of C-section; Short interval between pregnancies affecting pregnancy, antepartum; Alpha thalassemia silent carrier; Chronic hypertension; Elevated hemoglobin A1c; Anemia affecting pregnancy in third trimester; and Gestational diabetes on their problem list.  Patient reports no complaints.  Contractions: Not present. Vag. Bleeding: None.  Movement: Present. Denies leaking of fluid/ROM.   The following portions of the patient's history were reviewed and updated as appropriate: allergies, current medications, past family history, past medical history, past social history, past surgical history and problem list. Problem list updated.  Objective:   Vitals:   07/31/22 0921  BP: 112/75  Pulse: 86  Weight: 198 lb 12.8 oz (90.2 kg)    Fetal Status: Fetal Heart Rate (bpm): 150 Fundal Height: 31 cm Movement: Present     General:  Alert, oriented and cooperative. Patient is in no acute distress.  Skin: Skin is warm and dry. No rash noted.   Cardiovascular: Normal heart rate noted  Respiratory: Normal respiratory effort, no problems with respiration noted  Abdomen: Soft, gravid, appropriate for gestational age.  Pain/Pressure: Absent     Pelvic: Cervical exam deferred        Extremities: Normal range of motion.  Edema: None  Mental Status: Normal mood and affect. Normal behavior. Normal judgment and thought content.   Assessment and Plan:  Pregnancy:  G3P1001 at [redacted]w[redacted]d  1. Supervision of high risk pregnancy in third trimester  Centering Pregnancy, Session#5: Reviewed resources in [employer].   Facilitated discussion today:  Signs of labor, labor positions, coping strategies/support measures, preterm labor.  Fundal height and FHR appropriate today unless noted otherwise in plan. Patient to continue group care.     2. Chronic hypertension --On Procardia  3. Diet controlled gestational diabetes mellitus (GDM) in third trimester --Reviewed glucose log. Fasting values 2 out of 20 elevated. PP 4 out of 60 elevated. --Good glucose control, continue diet changes.   4. History of pre-eclampsia in prior pregnancy, currently pregnant --BP wnl today, no s/sx of PEC  5. History of C-section --VBAC consent form signed today by Dr Shawnie Pons. Counseling done at visit on 6/28.   6. [redacted] weeks gestation of pregnancy     Preterm labor symptoms and general obstetric precautions including but not limited to vaginal bleeding, contractions, leaking of fluid and fetal movement were reviewed in detail with the patient. Please refer to After Visit Summary for other counseling recommendations.  No follow-ups on file.  Future Appointments  Date Time Provider Department Center  08/08/2022 11:15 AM [facility] NURSE [facility]  08/08/2022 11:30 AM [facility] US2 [facility]  08/14/2022  9:00 AM CENTERING PROVIDER [facility]  08/15/2022  9:30 AM [facility] NURSE [facility]  08/15/2022  9:45 AM [facility] NST [facility]  08/22/2022  9:30 AM [facility] NURSE [facility]  08/22/2022  9:45 AM [facility] NST [facility]  08/28/2022  9:00 AM CENTERING PROVIDER [facility]  08/29/2022 10:30 AM [facility] NURSE [facility]  08/29/2022 10:45 AM [facility] NST [facility] [address] [facility] [address] [facility]  09/11/2022  9:00 AM CENTERING PROVIDER [facility]  09/25/2022  9:00 AM CENTERING PROVIDER [facility]  10/09/2022  9:00 AM CENTERING PROVIDER [facility]    Sharen Counter, CNM

## 2022-08-01 ENCOUNTER — Other Ambulatory Visit: Payer: Self-pay | Admitting: Advanced Practice Midwife

## 2022-08-08 ENCOUNTER — Ambulatory Visit: Payer: Medicaid Other | Attending: Maternal & Fetal Medicine

## 2022-08-08 ENCOUNTER — Ambulatory Visit: Payer: Medicaid Other | Admitting: *Deleted

## 2022-08-08 VITALS — BP 119/68 | HR 79

## 2022-08-08 DIAGNOSIS — O09293 Supervision of pregnancy with other poor reproductive or obstetric history, third trimester: Secondary | ICD-10-CM

## 2022-08-08 DIAGNOSIS — O99013 Anemia complicating pregnancy, third trimester: Secondary | ICD-10-CM | POA: Insufficient documentation

## 2022-08-08 DIAGNOSIS — D563 Thalassemia minor: Secondary | ICD-10-CM

## 2022-08-08 DIAGNOSIS — O10013 Pre-existing essential hypertension complicating pregnancy, third trimester: Secondary | ICD-10-CM | POA: Diagnosis not present

## 2022-08-08 DIAGNOSIS — O24410 Gestational diabetes mellitus in pregnancy, diet controlled: Secondary | ICD-10-CM

## 2022-08-08 DIAGNOSIS — O10919 Unspecified pre-existing hypertension complicating pregnancy, unspecified trimester: Secondary | ICD-10-CM | POA: Diagnosis present

## 2022-08-08 DIAGNOSIS — O34219 Maternal care for unspecified type scar from previous cesarean delivery: Secondary | ICD-10-CM

## 2022-08-08 DIAGNOSIS — E669 Obesity, unspecified: Secondary | ICD-10-CM

## 2022-08-08 DIAGNOSIS — O99213 Obesity complicating pregnancy, third trimester: Secondary | ICD-10-CM

## 2022-08-08 DIAGNOSIS — Z3A32 32 weeks gestation of pregnancy: Secondary | ICD-10-CM

## 2022-08-08 DIAGNOSIS — O35BXX0 Maternal care for other (suspected) fetal abnormality and damage, fetal cardiac anomalies, not applicable or unspecified: Secondary | ICD-10-CM

## 2022-08-12 ENCOUNTER — Encounter: Payer: Self-pay | Admitting: Family Medicine

## 2022-08-12 ENCOUNTER — Encounter (HOSPITAL_COMMUNITY): Payer: Self-pay | Admitting: Obstetrics & Gynecology

## 2022-08-12 ENCOUNTER — Other Ambulatory Visit: Payer: Self-pay

## 2022-08-12 ENCOUNTER — Inpatient Hospital Stay (HOSPITAL_COMMUNITY)
Admission: AD | Admit: 2022-08-12 | Discharge: 2022-08-12 | Discharge status: Against Medical Advice | Payer: Medicaid Other | Attending: Obstetrics & Gynecology | Admitting: Obstetrics & Gynecology

## 2022-08-12 DIAGNOSIS — W1830XA Fall on same level, unspecified, initial encounter: Secondary | ICD-10-CM | POA: Diagnosis not present

## 2022-08-12 DIAGNOSIS — O99513 Diseases of the respiratory system complicating pregnancy, third trimester: Secondary | ICD-10-CM | POA: Insufficient documentation

## 2022-08-12 DIAGNOSIS — W19XXXA Unspecified fall, initial encounter: Secondary | ICD-10-CM

## 2022-08-12 DIAGNOSIS — O10913 Unspecified pre-existing hypertension complicating pregnancy, third trimester: Secondary | ICD-10-CM | POA: Insufficient documentation

## 2022-08-12 DIAGNOSIS — O26893 Other specified pregnancy related conditions, third trimester: Secondary | ICD-10-CM | POA: Insufficient documentation

## 2022-08-12 DIAGNOSIS — Z3A32 32 weeks gestation of pregnancy: Secondary | ICD-10-CM | POA: Diagnosis not present

## 2022-08-12 DIAGNOSIS — Y939 Activity, unspecified: Secondary | ICD-10-CM | POA: Diagnosis not present

## 2022-08-12 LAB — URINALYSIS, ROUTINE W REFLEX MICROSCOPIC
Bilirubin Urine: NEGATIVE
Glucose, UA: NEGATIVE mg/dL
Hgb urine dipstick: NEGATIVE
Ketones, ur: NEGATIVE mg/dL
Leukocytes,Ua: NEGATIVE
Nitrite: NEGATIVE
Protein, ur: NEGATIVE mg/dL
Specific Gravity, Urine: 1.012 (ref 1.005–1.030)
pH: 6 (ref 5.0–8.0)

## 2022-08-12 NOTE — MAU Note (Signed)
.  Sabrina Dixon is a 21 y.o. at [redacted]w[redacted]d here in MAU reporting: was at work was going to move dishes - fell while trying to put them down and fell flat onto her back. States she has been feeling baby movement but they have not been as strong as before "really soft". States also hit her left arm in the process and it stings underneath upper arm. Denies pain anywehre else, VB or LOF.   Onset of complaint: 1830 Pain score: 8 Vitals:   08/12/22 1912  BP: 126/70  Pulse: 99  Resp: 18  Temp: 98.3 F (36.8 C)  SpO2: 99%     FHT:136 Lab orders placed from triage:  UA

## 2022-08-12 NOTE — MAU Provider Note (Signed)
History     CSN: [id number]  Arrival date and time: 08/12/22 1853   Event Date/Time   First Provider Initiated Contact with Patient 08/12/22 1955      Chief Complaint  Patient presents with   Fall   HPI  Ms.Sabrina Dixon is a 22 y.o. female G3P1001 @ [redacted]w[redacted]d here in MAU with complaints of fall that occurred around 1830. She was walking and carrying something at work and slipped and fell back on her butt and her back. She did not have direct trauma to her abdomen. She has no abdominal pain. No bleeding. Reports good fetal movement.   OB History     Gravida  3   Para  1   Term  1   Preterm      AB      Living  1      SAB      IAB      Ectopic      Multiple  0   Live Births  1           Past Medical History:  Diagnosis Date   Asthma    Chlamydia    GC (gonococcus)    History of gestational diabetes mellitus (GDM) 2023   Hypertension    Vaginal bleeding in pregnancy, first trimester 03/02/2022    Past Surgical History:  Procedure Laterality Date   CESAREAN SECTION N/A 03/30/2021   Procedure: CESAREAN SECTION;  Surgeon: Adam Phenix, MD;  Location: [facility] LD [facility]S;  Service: Obstetrics;  Laterality: N/A;    Family History  Problem Relation Age of Onset   Hypertension Mother    Cancer Neg Hx    Diabetes Neg Hx     Social History   Tobacco Use   Smoking status: Never   Smokeless tobacco: Never  Vaping Use   Vaping status: Former   Quit date: 02/01/2021   Substances: Flavoring  Substance Use Topics   Alcohol use: Not Currently    Comment: occasionally   Drug use: Not Currently    Types: Marijuana    Comment: last marijuana on 1/1    Allergies: No Known Allergies  Medications Prior to Admission  Medication Sig Dispense Refill Last Dose   aspirin EC 81 MG tablet Take 2 tablets (162 mg total) by mouth daily. Swallow whole. 60 tablet 5 08/12/2022   NIFEdipine (ADALAT CC) 90 MG 24 hr tablet Take 1 tablet (90 mg total) by mouth daily. 90 tablet  3 08/12/2022   prenatal vitamin w/FE, FA (NATACHEW) 29-1 MG CHEW chewable tablet Chew 1 tablet by mouth daily at 12 noon. 30 tablet 11 08/12/2022   Accu-Chek Softclix Lancets lancets Use as instructed 100 each 12    Blood Glucose Monitoring Suppl (ACCU-CHEK GUIDE) w/Device KIT 1 Device by Does not apply route as needed. 1 kit 0    glucose blood (ACCU-CHEK GUIDE) test strip Use as instructed 100 each 12    Ipratropium-Albuterol (COMBIVENT) 20-100 MCG/ACT AERS respimat Inhale 1 puff into the lungs every 6 (six) hours as needed for wheezing. 1 each 6    lidocaine (XYLOCAINE) 2 % solution Use as directed 15 mLs in the mouth or throat as needed for mouth pain. (Patient not taking: Reported on 03/16/2022) 100 mL 0    Misc. Devices (GOJJI WEIGHT SCALE) MISC 1 Device by Does not apply route as needed. (Patient not taking: Reported on 08/08/2022) 1 each 0    Results for orders placed or performed during the hospital  encounter of 08/12/22 (from the past 48 hour(s))  Urinalysis, Routine w reflex microscopic -Urine, Clean Catch     Status: None   Collection Time: 08/12/22  7:16 PM  Result Value Ref Range   Color, Urine YELLOW YELLOW   APPearance CLEAR CLEAR   Specific Gravity, Urine 1.012 1.005 - 1.030   pH 6.0 5.0 - 8.0   Glucose, UA NEGATIVE NEGATIVE mg/dL   Hgb urine dipstick NEGATIVE NEGATIVE   Bilirubin Urine NEGATIVE NEGATIVE   Ketones, ur NEGATIVE NEGATIVE mg/dL   Protein, ur NEGATIVE NEGATIVE mg/dL   Nitrite NEGATIVE NEGATIVE   Leukocytes,Ua NEGATIVE NEGATIVE    Comment: Performed at [facility], [address], [place], [place] [address]    Review of Systems  Constitutional:  Negative for fever.  Gastrointestinal:  Negative for abdominal pain.  Genitourinary:  Negative for vaginal bleeding and vaginal discharge.   Physical Exam   Blood pressure 135/76, pulse 87, temperature 98.3 F (36.8 C), temperature source Oral, resp. rate 17, height 5' (1.524 m), weight 92.4 kg, last menstrual  period 12/26/2021, SpO2 99%, unknown if currently breastfeeding.  Physical Exam Constitutional:      General: She is not in acute distress.    Appearance: Normal appearance. She is not ill-appearing, toxic-appearing or diaphoretic.  Abdominal:     Palpations: Abdomen is soft.  Skin:    General: Skin is warm.  Neurological:     Mental Status: She is alert and oriented to person, place, and time.    Fetal Tracing: Baseline: 130 bpm Variability: Moderate  Accelerations: 15x15 Decelerations: None Toco: None  MAU Course  Procedures  MDM  4 hours of fetal monitoring is recommended following a trauma in pregnancy. Patient states she is unable to stay and would like to know if fetal tracing is ok and if so she will sign out AMA. Strict return precautions reviewed.   Assessment and Plan   A:  1. Fall, initial encounter   2. [redacted] weeks gestation of pregnancy     P:  Fetal monitoring for about 1 hour and patient left AMA. Overall reassuring tracing.   Duane Lope, NP 08/12/2022 8:34 PM

## 2022-08-12 NOTE — Progress Notes (Signed)
Pt requesting to leave after recommendation to stay for fetal monitoring. NP made aware. Pt and RN signed AMA form.

## 2022-08-14 ENCOUNTER — Other Ambulatory Visit: Payer: Self-pay | Admitting: Advanced Practice Midwife

## 2022-08-14 ENCOUNTER — Ambulatory Visit: Payer: Medicaid Other | Admitting: Advanced Practice Midwife

## 2022-08-14 DIAGNOSIS — Z3A33 33 weeks gestation of pregnancy: Secondary | ICD-10-CM

## 2022-08-14 DIAGNOSIS — I10 Essential (primary) hypertension: Secondary | ICD-10-CM

## 2022-08-14 DIAGNOSIS — Z98891 History of uterine scar from previous surgery: Secondary | ICD-10-CM

## 2022-08-14 DIAGNOSIS — O24410 Gestational diabetes mellitus in pregnancy, diet controlled: Secondary | ICD-10-CM

## 2022-08-14 DIAGNOSIS — O99013 Anemia complicating pregnancy, third trimester: Secondary | ICD-10-CM

## 2022-08-14 DIAGNOSIS — O0993 Supervision of high risk pregnancy, unspecified, third trimester: Secondary | ICD-10-CM | POA: Diagnosis not present

## 2022-08-14 NOTE — Progress Notes (Signed)
Venofer orders placed for outpatient iron infusions x 2

## 2022-08-15 ENCOUNTER — Inpatient Hospital Stay (HOSPITAL_COMMUNITY): Admission: RE | Admit: 2022-08-15 | Payer: Medicaid Other | Source: Ambulatory Visit

## 2022-08-15 ENCOUNTER — Ambulatory Visit: Payer: Medicaid Other | Attending: Maternal & Fetal Medicine | Admitting: *Deleted

## 2022-08-15 ENCOUNTER — Ambulatory Visit: Payer: Medicaid Other

## 2022-08-15 VITALS — BP 133/69 | HR 96

## 2022-08-15 DIAGNOSIS — O99013 Anemia complicating pregnancy, third trimester: Secondary | ICD-10-CM

## 2022-08-15 DIAGNOSIS — O10913 Unspecified pre-existing hypertension complicating pregnancy, third trimester: Secondary | ICD-10-CM | POA: Insufficient documentation

## 2022-08-15 DIAGNOSIS — O24419 Gestational diabetes mellitus in pregnancy, unspecified control: Secondary | ICD-10-CM

## 2022-08-15 DIAGNOSIS — I10 Essential (primary) hypertension: Secondary | ICD-10-CM | POA: Diagnosis present

## 2022-08-15 DIAGNOSIS — Z3A33 33 weeks gestation of pregnancy: Secondary | ICD-10-CM

## 2022-08-15 NOTE — Progress Notes (Signed)
Sabrina Dixon [birth year] [redacted]w[redacted]d  Fetus A Non-Stress Test Interpretation for 08/15/22  Indication: Chronic Hypertenstion and Diabetes  Fetal Heart Rate A Mode: External Baseline Rate (A): 135 bpm Variability: Moderate Accelerations: 15 x 15 Decelerations: None  Uterine Activity Mode: Toco Contraction Frequency (min): No UC's Resting Tone Palpated: Relaxed Resting Time: Adequate  Interpretation (Fetal Testing) Nonstress Test Interpretation: Reactive Overall Impression: Reassuring for gestational age Comments: Tracing reviewed by Dr. Judeth Cornfield

## 2022-08-16 ENCOUNTER — Encounter (HOSPITAL_COMMUNITY): Payer: Medicaid Other

## 2022-08-16 NOTE — Procedures (Signed)
Sabrina Dixon 09-11-2000 [redacted]w[redacted]d  Fetus A Non-Stress Test Interpretation for 08/16/22  Indication: Chronic Hypertenstion  Fetal Heart Rate A Mode: External Baseline Rate (A): 135 bpm Variability: Moderate Accelerations: 15 x 15 Decelerations: None  Uterine Activity Mode: Toco Contraction Frequency (min): No UC's Resting Tone Palpated: Relaxed Resting Time: Adequate  Interpretation (Fetal Testing) Nonstress Test Interpretation: Reactive Overall Impression: Reassuring for gestational age Comments: Tracing reviewed by Dr. Judeth Cornfield

## 2022-08-17 NOTE — Progress Notes (Addendum)
       PRENATAL VISIT NOTE- Centering Pregnancy Cycle {NUMBERS:20191}, Session # {NUMBERS:20191}  Subjective:  Sabrina Dixon is a 22 y.o. G3P1001 at [redacted]w[redacted]d being seen today for ongoing prenatal care through Centering Pregnancy.  She is currently monitored for the following issues for this {Blank single:19197::"high-risk","low-risk"} pregnancy and has GDM (gestational diabetes mellitus); Supervision of high-risk pregnancy; History of pre-eclampsia in prior pregnancy, currently pregnant; Obesity affecting pregnancy; History of gestational diabetes mellitus (GDM) in prior pregnancy, currently pregnant; History of C-section; Short interval between pregnancies affecting pregnancy, antepartum; Alpha thalassemia silent carrier; Chronic hypertension; Elevated hemoglobin A1c; Anemia affecting pregnancy in third trimester; and Gestational diabetes on their problem list.  Patient reports {sx:14538}.   .  .   . ***Denies leaking of fluid/ROM.   The following portions of the patient's history were reviewed and updated as appropriate: allergies, current medications, past family history, past medical history, past social history, past surgical history and problem list. Problem list updated.  Objective:  There were no vitals filed for this visit.  Fetal Status:   Fundal Height: 31 cm       General:  Alert, oriented and cooperative. Patient is in no acute distress.  Skin: Skin is warm and dry. No rash noted.   Cardiovascular: Normal heart rate noted  Respiratory: Normal respiratory effort, no problems with respiration noted  Abdomen: Soft, gravid, appropriate for gestational age.        Pelvic: {Blank single:19197::"Cervical exam performed","Cervical exam deferred"}        Extremities: Normal range of motion.     Mental Status: Normal mood and affect. Normal behavior. Normal judgment and thought content.   Assessment and Plan:  Pregnancy: G3P1001 at [redacted]w[redacted]d  There are no diagnoses linked to this  encounter.  *** add [facility] dot phrase for session   {Blank single:19197::"Term","Preterm"} labor symptoms and general obstetric precautions including but not limited to vaginal bleeding, contractions, leaking of fluid and fetal movement were reviewed in detail with the patient. Please refer to After Visit Summary for other counseling recommendations.  No follow-ups on file.  Future Appointments  Date Time Provider Department Center  08/22/2022  9:30 AM [facility] NURSE [facility]  08/22/2022  9:45 AM [facility] NST [facility]  08/23/2022  9:00 AM [facility]6 [facility] None  08/28/2022  9:00 AM CENTERING PROVIDER [facility]  08/29/2022 10:30 AM [facility] NURSE [facility]  08/29/2022 10:45 AM [facility] NST [facility]  09/11/2022  9:00 AM CENTERING PROVIDER [facility]  09/25/2022  9:00 AM CENTERING PROVIDER [facility]  10/09/2022  9:00 AM CENTERING PROVIDER [facility]    Sharen Counter, CNM

## 2022-08-22 ENCOUNTER — Ambulatory Visit: Payer: Medicaid Other | Admitting: *Deleted

## 2022-08-22 ENCOUNTER — Ambulatory Visit: Payer: Medicaid Other | Attending: Maternal & Fetal Medicine | Admitting: *Deleted

## 2022-08-22 ENCOUNTER — Encounter: Payer: Self-pay | Admitting: *Deleted

## 2022-08-22 VITALS — BP 126/69 | HR 91

## 2022-08-22 DIAGNOSIS — O10013 Pre-existing essential hypertension complicating pregnancy, third trimester: Secondary | ICD-10-CM | POA: Diagnosis not present

## 2022-08-22 DIAGNOSIS — O10919 Unspecified pre-existing hypertension complicating pregnancy, unspecified trimester: Secondary | ICD-10-CM

## 2022-08-22 DIAGNOSIS — O24410 Gestational diabetes mellitus in pregnancy, diet controlled: Secondary | ICD-10-CM | POA: Diagnosis not present

## 2022-08-22 DIAGNOSIS — Z3A34 34 weeks gestation of pregnancy: Secondary | ICD-10-CM | POA: Diagnosis not present

## 2022-08-22 DIAGNOSIS — O99013 Anemia complicating pregnancy, third trimester: Secondary | ICD-10-CM

## 2022-08-22 NOTE — Procedures (Signed)
Sabrina Dixon 02-Oct-2000 [redacted]w[redacted]d  Fetus A Non-Stress Test Interpretation for 08/22/22  Indication: Chronic Hypertenstion  Fetal Heart Rate A Mode: External Baseline Rate (A): 130 bpm Variability: Moderate Accelerations: 15 x 15 Decelerations: None Multiple birth?: No  Uterine Activity Mode: Toco Contraction Frequency (min): One UC Contraction Duration (sec): 40 Contraction Quality: Mild Resting Tone Palpated: Relaxed Resting Time: Adequate  Interpretation (Fetal Testing) Nonstress Test Interpretation: Reactive Comments: Tracing reviewed by Dr. Grace Bushy

## 2022-08-23 ENCOUNTER — Encounter (HOSPITAL_COMMUNITY)
Admission: RE | Admit: 2022-08-23 | Discharge: 2022-08-23 | Disposition: A | Discharge status: Home / Self Care | Payer: Medicaid Other | Source: Ambulatory Visit | Attending: Advanced Practice Midwife | Admitting: Advanced Practice Midwife

## 2022-08-23 DIAGNOSIS — O99013 Anemia complicating pregnancy, third trimester: Secondary | ICD-10-CM | POA: Insufficient documentation

## 2022-08-23 DIAGNOSIS — Z3A34 34 weeks gestation of pregnancy: Secondary | ICD-10-CM | POA: Insufficient documentation

## 2022-08-23 MED ORDER — IRON SUCROSE 500 MG IVPB - SIMPLE MED
500.0000 mg | INTRAVENOUS | Status: DC
  Administered 2022-08-23: 500 mg via INTRAVENOUS
  Filled 2022-08-23: qty 500

## 2022-08-26 ENCOUNTER — Encounter: Payer: Self-pay | Admitting: Advanced Practice Midwife

## 2022-08-28 ENCOUNTER — Ambulatory Visit: Payer: Medicaid Other

## 2022-08-28 VITALS — BP 131/83 | HR 91 | Wt 206.2 lb

## 2022-08-28 DIAGNOSIS — Z3A35 35 weeks gestation of pregnancy: Secondary | ICD-10-CM | POA: Diagnosis not present

## 2022-08-28 DIAGNOSIS — I10 Essential (primary) hypertension: Secondary | ICD-10-CM

## 2022-08-28 DIAGNOSIS — O0993 Supervision of high risk pregnancy, unspecified, third trimester: Secondary | ICD-10-CM

## 2022-08-28 DIAGNOSIS — O99013 Anemia complicating pregnancy, third trimester: Secondary | ICD-10-CM

## 2022-08-28 DIAGNOSIS — O24410 Gestational diabetes mellitus in pregnancy, diet controlled: Secondary | ICD-10-CM

## 2022-08-28 MED ORDER — METFORMIN HCL 500 MG PO TABS
500.0000 mg | ORAL_TABLET | Freq: Two times a day (BID) | ORAL | 0 refills | Status: DC
Start: 2022-08-28 — End: 2022-09-01

## 2022-08-28 NOTE — Progress Notes (Signed)
PRENATAL VISIT NOTE- Centering Pregnancy Cycle 12, Session # 7  Subjective:  Sabrina Dixon is a 22 y.o. G3P1001 at [redacted]w[redacted]d being seen today for ongoing prenatal care through Centering Pregnancy.  She is currently monitored for the following issues for this high-risk pregnancy and has GDM (gestational diabetes mellitus); Supervision of high-risk pregnancy; History of pre-eclampsia in prior pregnancy, currently pregnant; Obesity affecting pregnancy; History of gestational diabetes mellitus (GDM) in prior pregnancy, currently pregnant; History of C-section; Short interval between pregnancies affecting pregnancy, antepartum; Alpha thalassemia silent carrier; Chronic hypertension; Elevated hemoglobin A1c; Anemia affecting pregnancy in third trimester; and Gestational diabetes on their problem list.  Patient reports no complaints.  Contractions: Not present. Vag. Bleeding: None.  Movement: Present (vaginal pain/pressure after being at work). Denies leaking of fluid/ROM.   The following portions of the patient's history were reviewed and updated as appropriate: allergies, current medications, past family history, past medical history, past social history, past surgical history and problem list. Problem list updated.  Objective:   Vitals:   08/28/22 0917  BP: 131/83  Pulse: 91  Weight: 206 lb 3.2 oz (93.5 kg)    Fetal Status: Fetal Heart Rate (bpm): 136 Fundal Height: 34 cm Movement: Present (vaginal pain/pressure after being at work)     General:  Alert, oriented and cooperative. Patient is in no acute distress.  Skin: Skin is warm and dry. No rash noted.   Cardiovascular: Normal heart rate noted  Respiratory: Normal respiratory effort, no problems with respiration noted  Abdomen: Soft, gravid, appropriate for gestational age.  Pain/Pressure: Present     Pelvic: Cervical exam deferred        Extremities: Normal range of motion.  Edema: None  Mental Status: Normal mood and affect. Normal  behavior. Normal judgment and thought content.   Assessment and Plan:  Pregnancy: G3P1001 at 107w0d  1. Supervision of high risk pregnancy in third trimester  Centering Pregnancy, Session#7: Reviewed resources in [employer].   Facilitated discussion today:  parenting, newborn safety, breastfeeding   Fundal height and FHR appropriate today unless noted otherwise in plan. Patient to continue group care.    2. Chronic hypertension --On Procardia 90 --BP wnl today, no s/sx of PEC  3. Diet controlled gestational diabetes mellitus (GDM) in third trimester --Pt does not have log but reports fasting glucose values are all in range but PP are mostly above 120.  Mostly high 120s with some in the  130s.   --Recommend starting medications. Pt prefers to start PO meds.  Pt agrees with plan of care. - metFORMIN (GLUCOPHAGE) 500 MG tablet; Take 1 tablet (500 mg total) by mouth 2 (two) times daily with a meal.  Dispense: 60 tablet; Refill: 0  4. Anemia affecting pregnancy in third trimester --S/P Venofer  5. [redacted] weeks gestation of pregnancy    Preterm labor symptoms and general obstetric precautions including but not limited to vaginal bleeding, contractions, leaking of fluid and fetal movement were reviewed in detail with the patient. Please refer to After Visit Summary for other counseling recommendations.  Return for Centering Sessions as scheduled.  Future Appointments  Date Time Provider Department Center  08/29/2022 10:30 AM [facility] NURSE [facility]  08/29/2022 10:45 AM [facility] NST [facility]  08/30/2022  9:15 AM [facility] US2 [facility]  09/06/2022  8:00 AM [facility]-RM6 [facility] None  09/11/2022  9:00 AM CENTERING PROVIDER [facility]  09/25/2022  9:00 AM CENTERING PROVIDER [facility]  10/09/2022  9:00  AM CENTERING PROVIDER [facility]    Sharen Counter, CNM

## 2022-08-29 ENCOUNTER — Ambulatory Visit: Payer: Medicaid Other | Attending: Maternal & Fetal Medicine

## 2022-08-29 ENCOUNTER — Ambulatory Visit: Payer: Medicaid Other

## 2022-08-30 ENCOUNTER — Other Ambulatory Visit: Payer: Self-pay | Admitting: Lactation Services

## 2022-08-30 ENCOUNTER — Ambulatory Visit (INDEPENDENT_AMBULATORY_CARE_PROVIDER_SITE_OTHER): Payer: Medicaid Other

## 2022-08-30 DIAGNOSIS — I10 Essential (primary) hypertension: Secondary | ICD-10-CM | POA: Diagnosis not present

## 2022-08-30 DIAGNOSIS — O24410 Gestational diabetes mellitus in pregnancy, diet controlled: Secondary | ICD-10-CM

## 2022-08-30 DIAGNOSIS — Z3A35 35 weeks gestation of pregnancy: Secondary | ICD-10-CM | POA: Diagnosis not present

## 2022-08-30 NOTE — Addendum Note (Signed)
Addended by: Faythe Casa on: 08/30/2022 05:43 AM   Modules accepted: Orders

## 2022-09-01 ENCOUNTER — Other Ambulatory Visit: Payer: Self-pay | Admitting: Advanced Practice Midwife

## 2022-09-01 DIAGNOSIS — O24410 Gestational diabetes mellitus in pregnancy, diet controlled: Secondary | ICD-10-CM

## 2022-09-01 MED ORDER — METFORMIN HCL 500 MG PO TABS
500.0000 mg | ORAL_TABLET | Freq: Two times a day (BID) | ORAL | 1 refills | Status: DC
Start: 2022-09-01 — End: 2022-09-27

## 2022-09-05 NOTE — Telephone Encounter (Signed)
Rx was updated in another encounter per provider.

## 2022-09-06 ENCOUNTER — Ambulatory Visit (HOSPITAL_COMMUNITY)
Admission: RE | Admit: 2022-09-06 | Discharge: 2022-09-06 | Disposition: A | Discharge status: Home / Self Care | Payer: Medicaid Other | Source: Ambulatory Visit | Attending: Advanced Practice Midwife | Admitting: Advanced Practice Midwife

## 2022-09-06 DIAGNOSIS — D649 Anemia, unspecified: Secondary | ICD-10-CM | POA: Diagnosis not present

## 2022-09-06 DIAGNOSIS — O99013 Anemia complicating pregnancy, third trimester: Secondary | ICD-10-CM | POA: Diagnosis present

## 2022-09-06 DIAGNOSIS — Z3A36 36 weeks gestation of pregnancy: Secondary | ICD-10-CM | POA: Insufficient documentation

## 2022-09-06 MED ORDER — SODIUM CHLORIDE 0.9 % IV SOLN: INTRAVENOUS | Status: DC | PRN

## 2022-09-06 MED ORDER — ALBUTEROL SULFATE (2.5 MG/3ML) 0.083% IN NEBU: 2.5000 mg | INHALATION_SOLUTION | Freq: Once | RESPIRATORY_TRACT | Status: DC | PRN

## 2022-09-06 MED ORDER — EPINEPHRINE PF 1 MG/ML IJ SOLN: 0.3000 mg | Freq: Once | INTRAMUSCULAR | Status: DC | PRN

## 2022-09-06 MED ORDER — DIPHENHYDRAMINE HCL 50 MG/ML IJ SOLN: 25.0000 mg | Freq: Once | INTRAMUSCULAR | Status: DC | PRN

## 2022-09-06 MED ORDER — SODIUM CHLORIDE 0.9 % IV BOLUS: 500.0000 mL | Freq: Once | INTRAVENOUS | Status: DC | PRN

## 2022-09-06 MED ORDER — METHYLPREDNISOLONE SODIUM SUCC 125 MG IJ SOLR: 125.0000 mg | Freq: Once | INTRAMUSCULAR | Status: DC | PRN

## 2022-09-06 MED ORDER — IRON SUCROSE 500 MG IVPB - SIMPLE MED
500.0000 mg | INTRAVENOUS | Status: DC
  Administered 2022-09-06: 500 mg via INTRAVENOUS
  Filled 2022-09-06: qty 500

## 2022-09-11 IMAGING — US US OB COMP LESS 14 WK
1 series · 16 of 20 positions shown · non-contrast
Comparison: None.

CLINICAL DATA: Vaginal bleeding and first-trimester pregnancy

EXAM:
OBSTETRIC <14 WK ULTRASOUND
TECHNIQUE: Transabdominal ultrasound was performed for evaluation of the
gestation as well as the maternal uterus and adnexal regions.

[Series 1: us ob comp less 14 wk · 20 acquisitions, 16 frames shown]
[im 1/20]
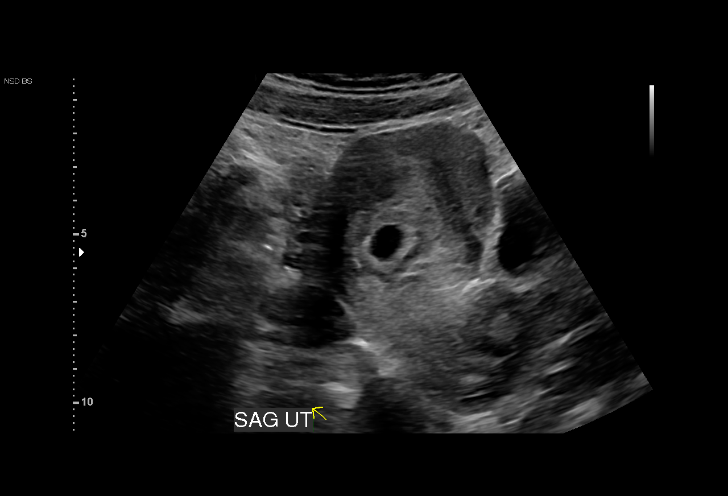
[im 2/20]
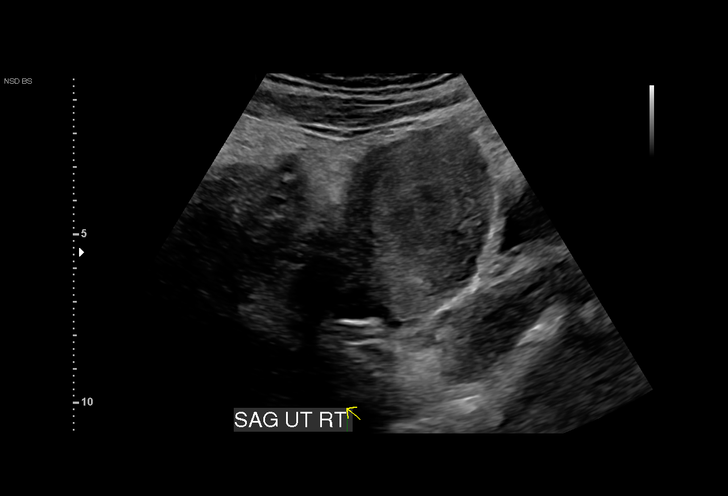
[im 4/20]
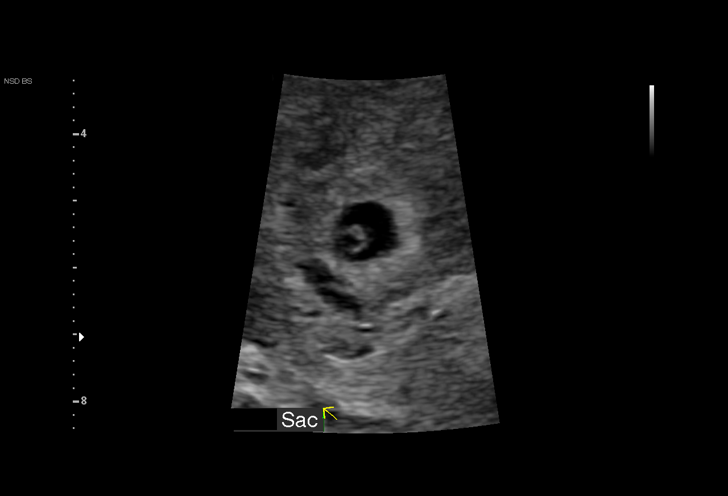
[im 5/20]
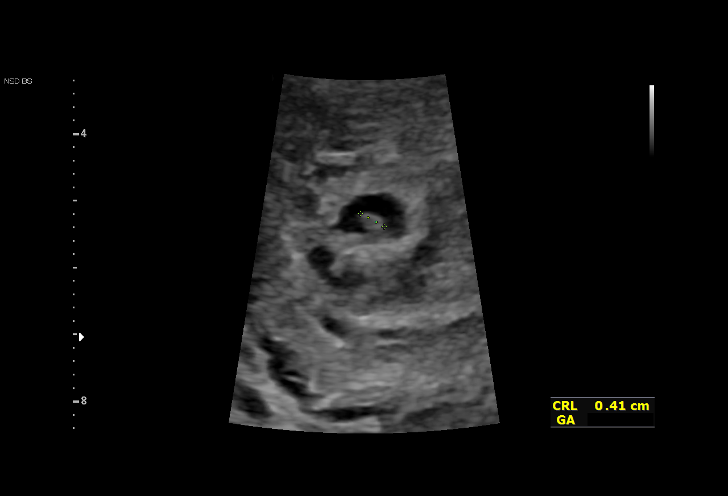
[im 6/20]
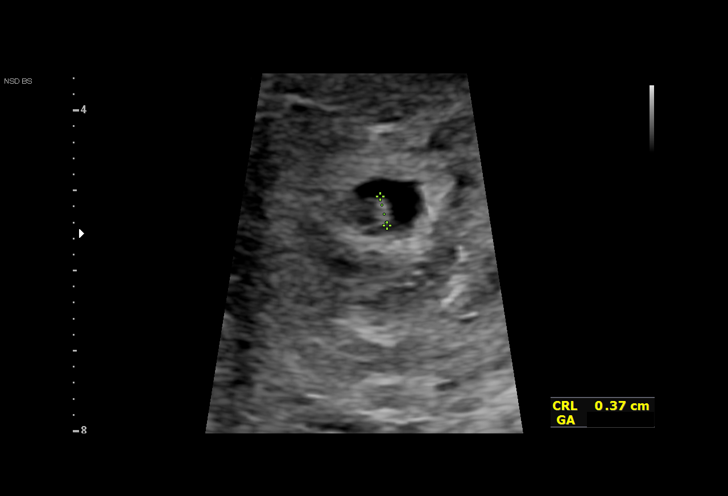
[im 7/20]
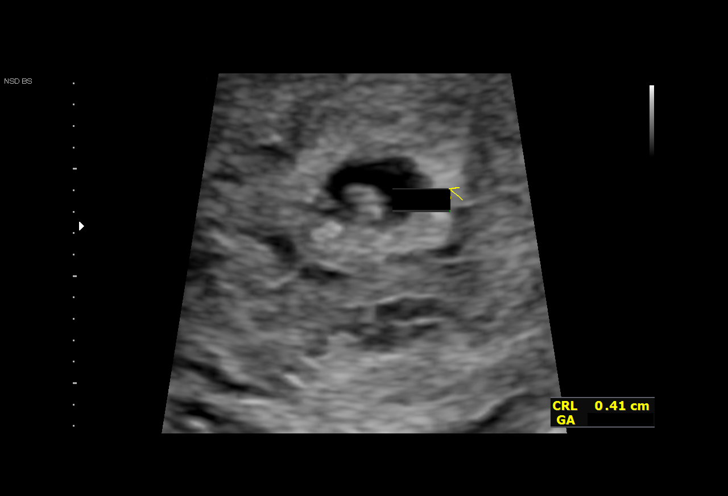
[im 9/20]
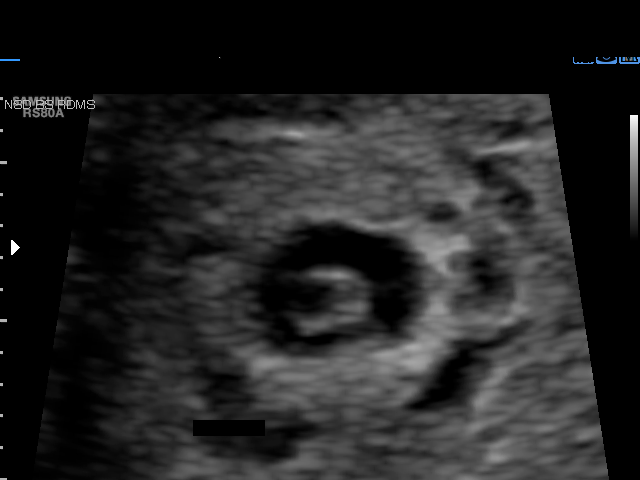
[im 10/20]
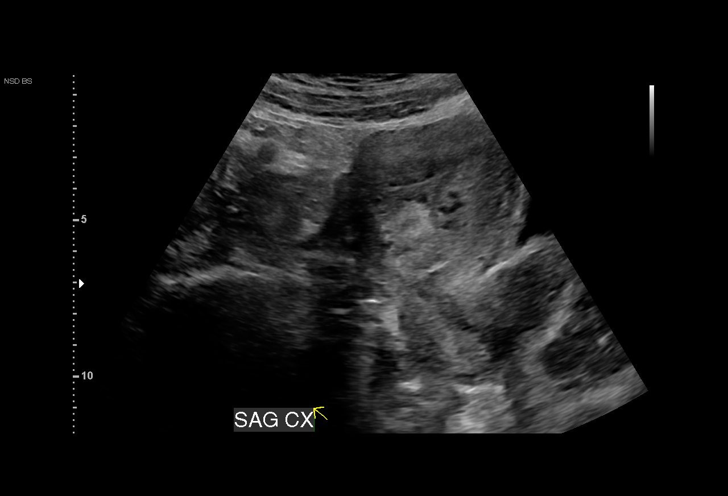
[im 11/20]
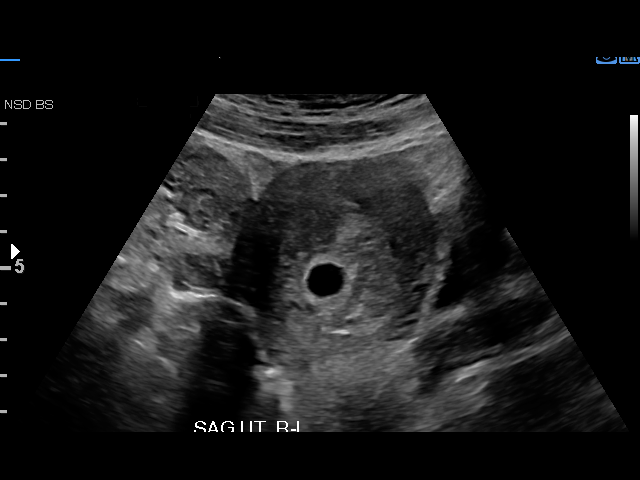
[im 12/20]
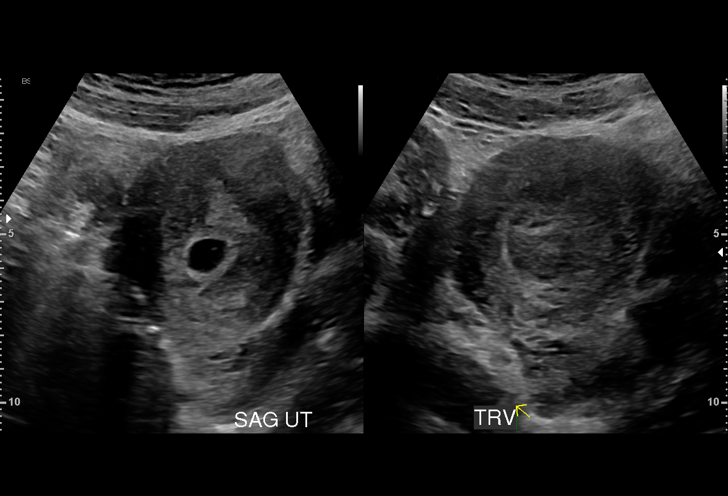
[im 14/20]
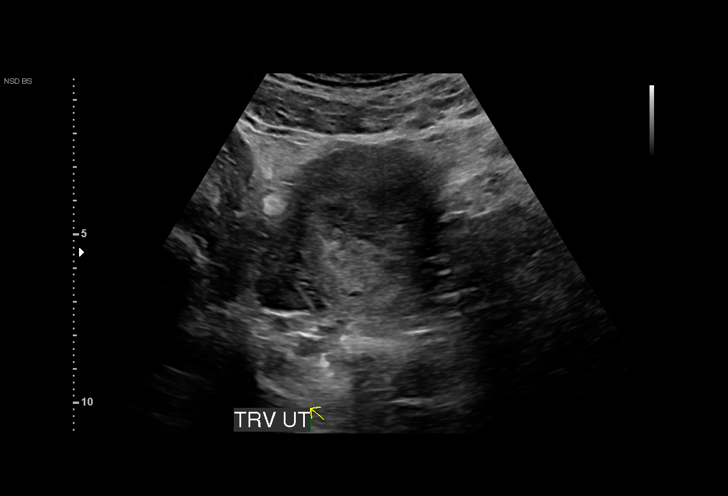
[im 15/20]
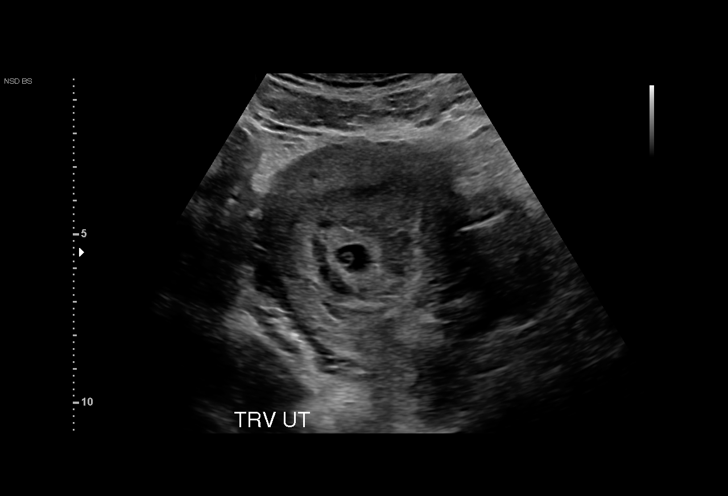
[im 16/20]
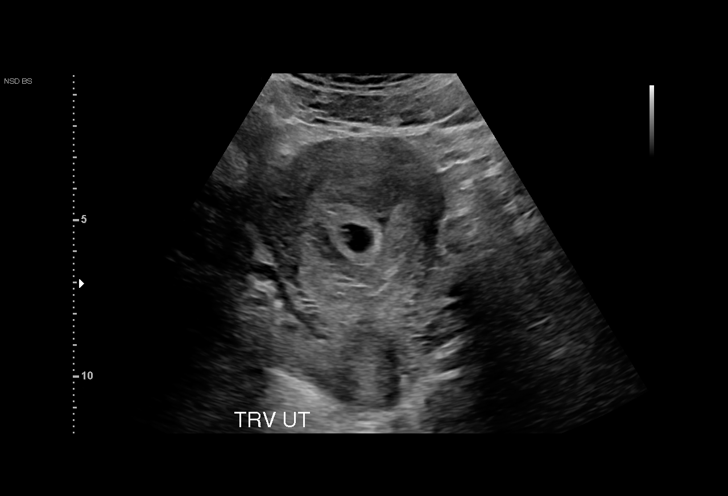
[im 17/20]
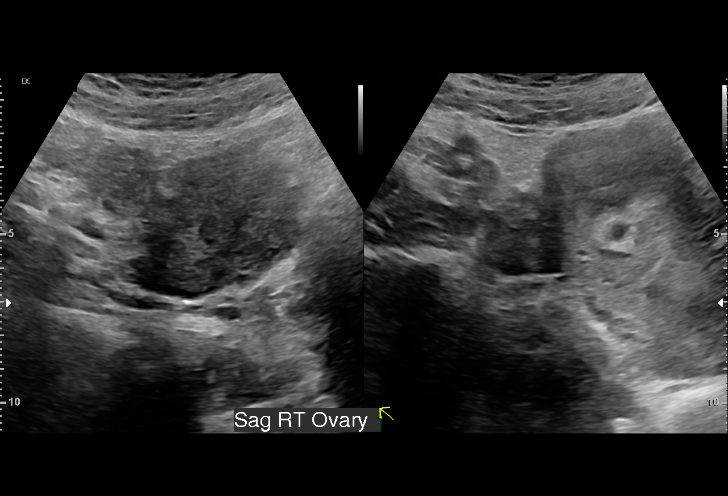
[im 19/20]
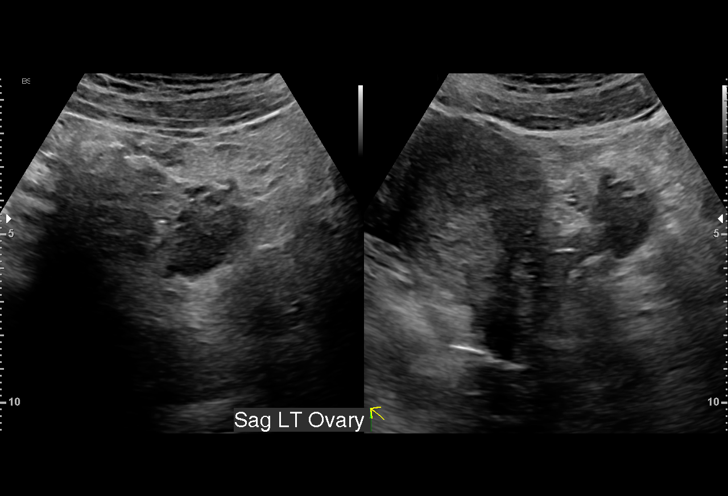
[im 20/20]
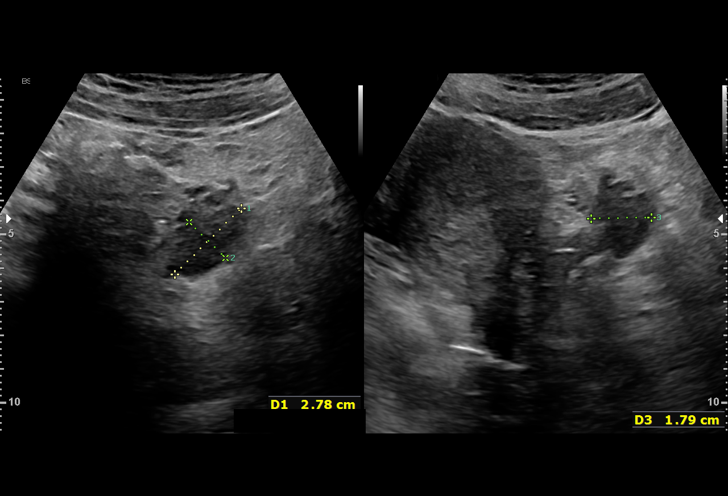

[16 of 20 positions shown; findings below may reference images not displayed]

FINDINGS: Intrauterine gestational sac: Single

Yolk sac:  Visualized.

Embryo:  Visualized.

Cardiac Activity: Visualized.

Heart Rate: 118 bpm

CRL:   4 mm   6 w 0 d                  US EDC: 04/17/2021

Subchorionic hemorrhage: Nearly isoechoic 23 by 10 mm subchorionic
collection.

Maternal uterus/adnexae: Unremarkable
IMPRESSION: 1. Single living intrauterine pregnancy measuring 6 weeks.
2. 22 x 10 mm subchorionic hematoma.

## 2022-09-11 NOTE — Progress Notes (Deleted)
   PRENATAL VISIT NOTE  Subjective:  Sabrina Dixon is a 22 y.o. G3P1001 at [redacted]w[redacted]d being seen today for ongoing prenatal care.  She is currently monitored for the following issues for this high-risk pregnancy and has GDM (gestational diabetes mellitus); Supervision of high-risk pregnancy; History of pre-eclampsia in prior pregnancy, currently pregnant; Obesity affecting pregnancy; History of gestational diabetes mellitus (GDM) in prior pregnancy, currently pregnant; History of C-section; Short interval between pregnancies affecting pregnancy, antepartum; Alpha thalassemia silent carrier; Chronic hypertension; Elevated hemoglobin A1c; Anemia affecting pregnancy in third trimester; and Gestational diabetes on their problem list.  Patient reports no complaints.   .  .   . Denies leaking of fluid.   The following portions of the patient's history were reviewed and updated as appropriate: allergies, current medications, past family history, past medical history, past social history, past surgical history and problem list.   Objective:  There were no vitals filed for this visit.  Fetal Status:           General:  Alert, oriented and cooperative. Patient is in no acute distress.  Skin: Skin is warm and dry. No rash noted.   Cardiovascular: Normal heart rate noted  Respiratory: Normal respiratory effort, no problems with respiration noted  Abdomen: Soft, gravid, appropriate for gestational age.        Pelvic: Cervical exam deferred        Extremities: Normal range of motion.     Mental Status: Normal mood and affect. Normal behavior. Normal judgment and thought content.   Assessment and Plan:  Pregnancy: G3P1001 at [redacted]w[redacted]d 1. Gestational diabetes mellitus (GDM) in third trimester controlled on oral hypoglycemic drug ***  2. Anemia affecting pregnancy in third trimester ***  3. History of C-section ***  4. Supervision of high risk pregnancy in third trimester ***  {Blank  single:19197::"Term","Preterm"} labor symptoms and general obstetric precautions including but not limited to vaginal bleeding, contractions, leaking of fluid and fetal movement were reviewed in detail with the patient. Please refer to After Visit Summary for other counseling recommendations.   No follow-ups on file.  Future Appointments  Date Time Provider Department Center  09/11/2022  9:00 AM CENTERING PROVIDER [facility]  09/25/2022  9:00 AM CENTERING PROVIDER [facility]  10/09/2022  9:00 AM CENTERING PROVIDER [facility]    Federico Flake, MD

## 2022-09-14 ENCOUNTER — Encounter: Payer: Self-pay | Admitting: Advanced Practice Midwife

## 2022-09-18 DIAGNOSIS — O24415 Gestational diabetes mellitus in pregnancy, controlled by oral hypoglycemic drugs: Secondary | ICD-10-CM

## 2022-09-21 ENCOUNTER — Ambulatory Visit: Payer: Medicaid Other

## 2022-09-21 ENCOUNTER — Other Ambulatory Visit (HOSPITAL_COMMUNITY)
Admission: RE | Admit: 2022-09-21 | Discharge: 2022-09-21 | Disposition: A | Discharge status: Home / Self Care | Payer: Medicaid Other | Source: Ambulatory Visit | Attending: Obstetrics and Gynecology | Admitting: Obstetrics and Gynecology

## 2022-09-21 ENCOUNTER — Encounter: Payer: Self-pay | Admitting: Obstetrics and Gynecology

## 2022-09-21 ENCOUNTER — Ambulatory Visit (INDEPENDENT_AMBULATORY_CARE_PROVIDER_SITE_OTHER): Payer: Medicaid Other | Admitting: Obstetrics and Gynecology

## 2022-09-21 VITALS — BP 131/87 | HR 93 | Wt 209.7 lb

## 2022-09-21 DIAGNOSIS — O24415 Gestational diabetes mellitus in pregnancy, controlled by oral hypoglycemic drugs: Secondary | ICD-10-CM | POA: Diagnosis not present

## 2022-09-21 DIAGNOSIS — O98813 Other maternal infectious and parasitic diseases complicating pregnancy, third trimester: Secondary | ICD-10-CM | POA: Diagnosis not present

## 2022-09-21 DIAGNOSIS — O99013 Anemia complicating pregnancy, third trimester: Secondary | ICD-10-CM

## 2022-09-21 DIAGNOSIS — O0993 Supervision of high risk pregnancy, unspecified, third trimester: Secondary | ICD-10-CM

## 2022-09-21 DIAGNOSIS — O99213 Obesity complicating pregnancy, third trimester: Secondary | ICD-10-CM

## 2022-09-21 DIAGNOSIS — O09893 Supervision of other high risk pregnancies, third trimester: Secondary | ICD-10-CM | POA: Insufficient documentation

## 2022-09-21 DIAGNOSIS — Z113 Encounter for screening for infections with a predominantly sexual mode of transmission: Secondary | ICD-10-CM | POA: Insufficient documentation

## 2022-09-21 DIAGNOSIS — Z98891 History of uterine scar from previous surgery: Secondary | ICD-10-CM

## 2022-09-21 DIAGNOSIS — Z112 Encounter for screening for other bacterial diseases: Secondary | ICD-10-CM | POA: Insufficient documentation

## 2022-09-21 DIAGNOSIS — I10 Essential (primary) hypertension: Secondary | ICD-10-CM

## 2022-09-21 DIAGNOSIS — Z3A38 38 weeks gestation of pregnancy: Secondary | ICD-10-CM | POA: Diagnosis not present

## 2022-09-21 DIAGNOSIS — A568 Sexually transmitted chlamydial infection of other sites: Secondary | ICD-10-CM

## 2022-09-21 DIAGNOSIS — Z8759 Personal history of other complications of pregnancy, childbirth and the puerperium: Secondary | ICD-10-CM

## 2022-09-21 DIAGNOSIS — O98313 Other infections with a predominantly sexual mode of transmission complicating pregnancy, third trimester: Secondary | ICD-10-CM

## 2022-09-21 DIAGNOSIS — Z6841 Body Mass Index (BMI) 40.0 and over, adult: Secondary | ICD-10-CM

## 2022-09-21 NOTE — Progress Notes (Signed)
   PRENATAL VISIT NOTE  Subjective:  Sabrina Dixon is a 22 y.o. G2P1001 at [redacted]w[redacted]d being seen today for ongoing prenatal care.  She is currently monitored for the following issues for this high-risk pregnancy and has Supervision of high-risk pregnancy; History of severe pre-eclampsia; Obesity affecting pregnancy; History of C-section; Alpha thalassemia silent carrier; Chronic hypertension; Elevated hemoglobin A1c; Anemia affecting pregnancy in third trimester; Gestational diabetes; and Short interval between pregnancies affecting pregnancy in third trimester, antepartum on their problem list.  Patient reports no complaints.  Contractions: Irritability. Vag. Bleeding: None.  Movement: Present. Denies leaking of fluid.   The following portions of the patient's history were reviewed and updated as appropriate: allergies, current medications, past family history, past medical history, past social history, past surgical history and problem list.   Objective:   Vitals:   09/21/22 1056  BP: 131/87  Pulse: 93  Weight: 209 lb 11.2 oz (95.1 kg)    Fetal Status: Fetal Heart Rate (bpm): 141   Movement: Present     General:  Alert, oriented and cooperative. Patient is in no acute distress.  Skin: Skin is warm and dry. No rash noted.   Cardiovascular: Normal heart rate noted  Respiratory: Normal respiratory effort, no problems with respiration noted  Abdomen: Soft, gravid, appropriate for gestational age.  Pain/Pressure: Present     Pelvic: Cervical exam deferred        Extremities: Normal range of motion.  Edema: Trace  Mental Status: Normal mood and affect. Normal behavior. Normal judgment and thought content.   Assessment and Plan:  Pregnancy: G2P1001 at [redacted]w[redacted]d 1. [redacted] weeks gestation of pregnancy GBS today No PNC since 7/29 2. Anemia affecting pregnancy in third trimester S/p venofer 1000mg , last dose on 8/7  3. Chronic hypertension Stable on procardia xl 90 qday For weekly bpp today Patient  set up for 8/25 AM IOL 7/31: ceph, afi 23, 8/8 7/9: 31%, 1857g, ac 36%, 8/8, ceph, af 16  4. Gestational diabetes mellitus (GDM) in third trimester controlled on oral hypoglycemic drug Clinic bpp set up for today Patient no showed to 7/30 mfm growth u/s. Leopolds 3800gm  5. History of C-section 03/2021 PLTCS (37wk IOL for severe pre-x based on BPs, NRFHT 3-4cm).  Consented on 7/1 for TOLAC  6. History of severe pre-eclampsia  7. Obesity affecting pregnancy in third trimester, unspecified obesity type  8. BMI 40.0-44.9, adult (HCC)  9. Short interval between pregnancies affecting pregnancy in third trimester, antepartum  10. Supervision of high risk pregnancy in third trimester  Preterm labor symptoms and general obstetric precautions including but not limited to vaginal bleeding, contractions, leaking of fluid and fetal movement were reviewed in detail with the patient. Please refer to After Visit Summary for other counseling recommendations.   No follow-ups on file.  Future Appointments  Date Time Provider Department Center  09/24/2022  6:30 AM [facility] SCHED ROOM [facility] None  09/25/2022  9:00 AM CENTERING PROVIDER [facility]  10/09/2022  9:00 AM CENTERING PROVIDER [facility]    Corazon Bing, MD

## 2022-09-22 ENCOUNTER — Telehealth (HOSPITAL_COMMUNITY): Payer: Self-pay | Admitting: *Deleted

## 2022-09-22 ENCOUNTER — Encounter (HOSPITAL_COMMUNITY): Payer: Self-pay | Admitting: *Deleted

## 2022-09-22 LAB — CERVICOVAGINAL ANCILLARY ONLY
Chlamydia: POSITIVE — AB
Comment: NEGATIVE
Comment: NORMAL
Neisseria Gonorrhea: NEGATIVE

## 2022-09-22 NOTE — Telephone Encounter (Signed)
Preadmission screen  

## 2022-09-23 ENCOUNTER — Encounter: Payer: Self-pay | Admitting: Advanced Practice Midwife

## 2022-09-23 LAB — STREP GP B NAA: Strep Gp B NAA: NEGATIVE

## 2022-09-24 ENCOUNTER — Other Ambulatory Visit: Payer: Self-pay

## 2022-09-24 ENCOUNTER — Inpatient Hospital Stay (HOSPITAL_COMMUNITY)
Admission: RE | Admit: 2022-09-24 | Discharge: 2022-09-27 | DRG: 787 | Disposition: A | Discharge status: Home / Self Care | Payer: Medicaid Other | Attending: Obstetrics and Gynecology | Admitting: Obstetrics and Gynecology

## 2022-09-24 ENCOUNTER — Inpatient Hospital Stay (HOSPITAL_COMMUNITY): Payer: Medicaid Other

## 2022-09-24 ENCOUNTER — Encounter (HOSPITAL_COMMUNITY): Payer: Self-pay | Admitting: Obstetrics and Gynecology

## 2022-09-24 DIAGNOSIS — Z3A39 39 weeks gestation of pregnancy: Secondary | ICD-10-CM | POA: Diagnosis not present

## 2022-09-24 DIAGNOSIS — Z148 Genetic carrier of other disease: Secondary | ICD-10-CM

## 2022-09-24 DIAGNOSIS — O403XX0 Polyhydramnios, third trimester, not applicable or unspecified: Secondary | ICD-10-CM | POA: Diagnosis not present

## 2022-09-24 DIAGNOSIS — O1092 Unspecified pre-existing hypertension complicating childbirth: Secondary | ICD-10-CM | POA: Diagnosis present

## 2022-09-24 DIAGNOSIS — O99214 Obesity complicating childbirth: Secondary | ICD-10-CM | POA: Diagnosis present

## 2022-09-24 DIAGNOSIS — O34211 Maternal care for low transverse scar from previous cesarean delivery: Secondary | ICD-10-CM | POA: Diagnosis present

## 2022-09-24 DIAGNOSIS — O99013 Anemia complicating pregnancy, third trimester: Principal | ICD-10-CM

## 2022-09-24 DIAGNOSIS — O10919 Unspecified pre-existing hypertension complicating pregnancy, unspecified trimester: Secondary | ICD-10-CM | POA: Diagnosis present

## 2022-09-24 DIAGNOSIS — O9832 Other infections with a predominantly sexual mode of transmission complicating childbirth: Secondary | ICD-10-CM | POA: Diagnosis present

## 2022-09-24 DIAGNOSIS — A568 Sexually transmitted chlamydial infection of other sites: Secondary | ICD-10-CM | POA: Diagnosis present

## 2022-09-24 DIAGNOSIS — O24425 Gestational diabetes mellitus in childbirth, controlled by oral hypoglycemic drugs: Secondary | ICD-10-CM | POA: Diagnosis present

## 2022-09-24 DIAGNOSIS — Z7982 Long term (current) use of aspirin: Secondary | ICD-10-CM | POA: Diagnosis not present

## 2022-09-24 DIAGNOSIS — Z3A38 38 weeks gestation of pregnancy: Secondary | ICD-10-CM | POA: Diagnosis not present

## 2022-09-24 DIAGNOSIS — O24424 Gestational diabetes mellitus in childbirth, insulin controlled: Secondary | ICD-10-CM | POA: Diagnosis not present

## 2022-09-24 DIAGNOSIS — O1002 Pre-existing essential hypertension complicating childbirth: Secondary | ICD-10-CM | POA: Diagnosis not present

## 2022-09-24 DIAGNOSIS — O99324 Drug use complicating childbirth: Secondary | ICD-10-CM | POA: Diagnosis not present

## 2022-09-24 LAB — COMPREHENSIVE METABOLIC PANEL
ALT: 9 U/L (ref 0–44)
AST: 13 U/L — ABNORMAL LOW (ref 15–41)
Albumin: 2.5 g/dL — ABNORMAL LOW (ref 3.5–5.0)
Alkaline Phosphatase: 128 U/L — ABNORMAL HIGH (ref 38–126)
Anion gap: 13 (ref 5–15)
BUN: 7 mg/dL (ref 6–20)
CO2: 17 mmol/L — ABNORMAL LOW (ref 22–32)
Calcium: 8.4 mg/dL — ABNORMAL LOW (ref 8.9–10.3)
Chloride: 106 mmol/L (ref 98–111)
Creatinine, Ser: 0.64 mg/dL (ref 0.44–1.00)
GFR, Estimated: >60 mL/min (ref >60)
Glucose, Bld: 234 mg/dL — ABNORMAL HIGH (ref 70–99)
Potassium: 3.6 mmol/L (ref 3.5–5.1)
Sodium: 136 mmol/L (ref 135–145)
Total Bilirubin: 0.6 mg/dL (ref 0.3–1.2)
Total Protein: 5.7 g/dL — ABNORMAL LOW (ref 6.5–8.1)

## 2022-09-24 LAB — CBC
HCT: 30.7 % — ABNORMAL LOW (ref 36.0–46.0)
Hemoglobin: 9.4 g/dL — ABNORMAL LOW (ref 12.0–15.0)
MCH: 20.8 pg — ABNORMAL LOW (ref 26.0–34.0)
MCHC: 30.6 g/dL (ref 30.0–36.0)
MCV: 68.1 fL — ABNORMAL LOW (ref 80.0–100.0)
Platelets: 156 10*3/uL (ref 150–400)
RBC: 4.51 MIL/uL (ref 3.87–5.11)
RDW: 21.2 % — ABNORMAL HIGH (ref 11.5–15.5)
WBC: 6.9 10*3/uL (ref 4.0–10.5)
nRBC: 0 % (ref 0.0–0.2)

## 2022-09-24 LAB — GLUCOSE, CAPILLARY
Glucose-Capillary: 220 mg/dL — ABNORMAL HIGH (ref 70–99)
Glucose-Capillary: 155 mg/dL — ABNORMAL HIGH (ref 70–99)
Glucose-Capillary: 108 mg/dL — ABNORMAL HIGH (ref 70–99)
Glucose-Capillary: 94 mg/dL (ref 70–99)
Glucose-Capillary: 97 mg/dL (ref 70–99)

## 2022-09-24 LAB — RPR: RPR Ser Ql: NONREACTIVE

## 2022-09-24 LAB — PROTEIN / CREATININE RATIO, URINE
Creatinine, Urine: 63 mg/dL
Protein Creatinine Ratio: 0.19 mg/mg{Cre} — ABNORMAL HIGH (ref 0.00–0.15)
Total Protein, Urine: 12 mg/dL

## 2022-09-24 LAB — TYPE AND SCREEN
ABO/RH(D): O POS
Antibody Screen: NEGATIVE

## 2022-09-24 MED ORDER — OXYTOCIN-SODIUM CHLORIDE 30-0.9 UT/500ML-% IV SOLN
2.5000 [IU]/h | INTRAVENOUS | Status: DC
  Filled 2022-09-24: qty 500

## 2022-09-24 MED ORDER — LACTATED RINGERS IV SOLN
500.0000 mL | INTRAVENOUS | Status: DC | PRN
  Administered 2022-09-25 (×2): 250 mL via INTRAVENOUS
  Administered 2022-09-25: 500 mL via INTRAVENOUS

## 2022-09-24 MED ORDER — SODIUM CHLORIDE 0.9 % IV SOLN: 5.0000 10*6.[IU] | Freq: Once | INTRAVENOUS | Status: DC

## 2022-09-24 MED ORDER — ACETAMINOPHEN 325 MG PO TABS: 650.0000 mg | ORAL_TABLET | ORAL | Status: DC | PRN

## 2022-09-24 MED ORDER — TERBUTALINE SULFATE 1 MG/ML IJ SOLN: 0.2500 mg | Freq: Once | INTRAMUSCULAR | Status: DC | PRN

## 2022-09-24 MED ORDER — INSULIN ASPART 100 UNIT/ML IJ SOLN
0.0000 [IU] | INTRAMUSCULAR | Status: DC
  Administered 2022-09-25: 2 [IU] via SUBCUTANEOUS

## 2022-09-24 MED ORDER — OXYTOCIN-SODIUM CHLORIDE 30-0.9 UT/500ML-% IV SOLN
1.0000 m[IU]/min | INTRAVENOUS | Status: DC
  Administered 2022-09-24: 2 m[IU]/min via INTRAVENOUS

## 2022-09-24 MED ORDER — INSULIN ASPART 100 UNIT/ML IJ SOLN
0.0000 [IU] | Freq: Three times a day (TID) | INTRAMUSCULAR | Status: DC
  Administered 2022-09-24: 3 [IU] via SUBCUTANEOUS

## 2022-09-24 MED ORDER — INSULIN ASPART 100 UNIT/ML IJ SOLN: 10.0000 [IU] | Freq: Once | INTRAMUSCULAR | Status: DC

## 2022-09-24 MED ORDER — TERBUTALINE SULFATE 1 MG/ML IJ SOLN
0.2500 mg | Freq: Once | INTRAMUSCULAR | Status: AC | PRN
  Administered 2022-09-25: 0.25 mg via SUBCUTANEOUS
  Filled 2022-09-24: qty 1

## 2022-09-24 MED ORDER — PENICILLIN G POT IN DEXTROSE 60000 UNIT/ML IV SOLN: 3.0000 10*6.[IU] | INTRAVENOUS | Status: DC

## 2022-09-24 MED ORDER — FENTANYL CITRATE (PF) 100 MCG/2ML IJ SOLN
50.0000 ug | INTRAMUSCULAR | Status: AC | PRN
  Administered 2022-09-24 (×2): 50 ug via INTRAVENOUS
  Filled 2022-09-24 (×2): qty 2

## 2022-09-24 MED ORDER — SOD CITRATE-CITRIC ACID 500-334 MG/5ML PO SOLN
30.0000 mL | ORAL | Status: DC | PRN
  Filled 2022-09-24: qty 30

## 2022-09-24 MED ORDER — AZITHROMYCIN 500 MG PO TABS: 1000.0000 mg | ORAL_TABLET | Freq: Once | ORAL | 0 refills | Status: AC

## 2022-09-24 MED ORDER — LACTATED RINGERS IV SOLN: INTRAVENOUS | Status: DC

## 2022-09-24 MED ORDER — ONDANSETRON HCL 4 MG/2ML IJ SOLN
4.0000 mg | Freq: Four times a day (QID) | INTRAMUSCULAR | Status: DC | PRN
  Administered 2022-09-25: 4 mg via INTRAVENOUS
  Filled 2022-09-24: qty 2

## 2022-09-24 MED ORDER — OXYTOCIN BOLUS FROM INFUSION: 333.0000 mL | Freq: Once | INTRAVENOUS | Status: DC

## 2022-09-24 MED ORDER — LIDOCAINE HCL (PF) 1 % IJ SOLN: 30.0000 mL | INTRAMUSCULAR | Status: DC | PRN

## 2022-09-24 MED ORDER — FENTANYL CITRATE (PF) 100 MCG/2ML IJ SOLN
100.0000 ug | INTRAMUSCULAR | Status: DC | PRN
  Administered 2022-09-24 – 2022-09-25 (×2): 100 ug via INTRAVENOUS
  Filled 2022-09-24 (×2): qty 2

## 2022-09-24 MED ORDER — AZITHROMYCIN 500 MG PO TABS
1000.0000 mg | ORAL_TABLET | Freq: Once | ORAL | Status: AC
  Administered 2022-09-24: 1000 mg via ORAL
  Filled 2022-09-24: qty 2

## 2022-09-24 MED ORDER — NIFEDIPINE ER OSMOTIC RELEASE 30 MG PO TB24
30.0000 mg | ORAL_TABLET | Freq: Every day | ORAL | Status: DC
  Administered 2022-09-25: 30 mg via ORAL
  Filled 2022-09-24: qty 1

## 2022-09-24 NOTE — Progress Notes (Signed)
LABOR PROGRESS NOTE  Bunny Hort is a 22 y.o. G2P1001 at [redacted]w[redacted]d presented for IOL for A2GDM on metformin, cHTN on procardia. TOLAC.  S: Resting.  O:  BP (!) 131/92   Pulse 64   Temp 97.7 F (36.5 C) (Oral)   Resp 17   Ht 5' (1.524 m)   Wt 96.8 kg   LMP 12/26/2021 (Exact Date)   BMI 41.70 kg/m  EFM:120 bpm/Moderate variability/ 15x15 accels/ None decels CAT: 1 Toco: irregular, every 1-4 minutes   CVE: Dilation: 1 Effacement (%): 50 Cervical Position: Anterior Station: -3 Presentation: Vertex Exam by:: Ivonne Andrew, CNM   A&P: 22 y.o. G2P1001 [redacted]w[redacted]d  here for IOL as above  #Labor: Continue with cervical ripening. Foley bulb in place with pitocin. #Pain: Per pt #FWB: CAT 1 #GBS negative  #A2GDM: Continue metformin 500 BID. Q2 glucose with SSI. Low threshold for endotool #cHTN: Continue procardia 90  Joanne Gavel, MD FMOB Fellow, Faculty practice [facility], Center for [facility] 09/24/22  9:14 PM

## 2022-09-24 NOTE — Progress Notes (Signed)
Sabrina Dixon is a 22 y.o. G2P1001 at [redacted]w[redacted]d.  Subjective: Moderate cramping w/ UC's. Coping well w/ IV pain meds.   Objective: BP (!) 147/90   Pulse (!) 59   Temp 97.7 F (36.5 C) (Oral)   Resp 17   Ht 5' (1.524 m)   Wt 96.8 kg   LMP 12/26/2021 (Exact Date)   BMI 41.70 kg/m    FHT:  FHR: 135 bpm, variability: mod 15x15,  accelerations:  15x15,  decelerations:  none UC:   Q 2-4 minutes, mild-mod Dilation: 1 Effacement (%): 50 Cervical Position: Anterior Station: -3 Presentation: Vertex Exam by:: Ivonne Andrew, CNM Foley bulb placed w/out difficulty. Pt tolerated well.   Labs: Results for orders placed or performed during the hospital encounter of 09/24/22 (from the past 24 hour(s))  CBC     Status: Abnormal   Collection Time: 09/24/22 12:04 PM  Result Value Ref Range   WBC 6.9 4.0 - 10.5 K/uL   RBC 4.51 3.87 - 5.11 MIL/uL   Hemoglobin 9.4 (L) 12.0 - 15.0 g/dL   HCT 44.0 (L) 10.2 - 72.5 %   MCV 68.1 (L) 80.0 - 100.0 fL   MCH 20.8 (L) 26.0 - 34.0 pg   MCHC 30.6 30.0 - 36.0 g/dL   RDW 36.6 (H) 44.0 - 34.7 %   Platelets 156 150 - 400 K/uL   nRBC 0.0 0.0 - 0.2 %  Type and screen     Status: None   Collection Time: 09/24/22 12:04 PM  Result Value Ref Range   ABO/RH(D) O POS    Antibody Screen NEG    Sample Expiration      09/27/2022,2359 Performed at [facility], [address], [place], [place] [address]   RPR     Status: None   Collection Time: 09/24/22 12:04 PM  Result Value Ref Range   RPR Ser Ql NON REACTIVE NON REACTIVE  Comprehensive metabolic panel     Status: Abnormal   Collection Time: 09/24/22 12:04 PM  Result Value Ref Range   Sodium 136 135 - 145 mmol/L   Potassium 3.6 3.5 - 5.1 mmol/L   Chloride 106 98 - 111 mmol/L   CO2 17 (L) 22 - 32 mmol/L   Glucose, Bld 234 (H) 70 - 99 mg/dL   BUN 7 6 - 20 mg/dL   Creatinine, Ser 6.38 0.44 - 1.00 mg/dL   Calcium 8.4 (L) 8.9 - 10.3 mg/dL   Total Protein 5.7 (L) 6.5 - 8.1 g/dL   Albumin 2.5 (L) 3.5 - 5.0  g/dL   AST 13 (L) 15 - 41 U/L   ALT 9 0 - 44 U/L   Alkaline Phosphatase 128 (H) 38 - 126 U/L   Total Bilirubin 0.6 0.3 - 1.2 mg/dL   GFR, Estimated >75 >64 mL/min   Anion gap 13 5 - 15  Protein / creatinine ratio, urine     Status: Abnormal   Collection Time: 09/24/22 12:04 PM  Result Value Ref Range   Creatinine, Urine 63 mg/dL   Total Protein, Urine 12 mg/dL   Protein Creatinine Ratio 0.19 (H) 0.00 - 0.15 mg/mg[Cre]  Glucose, capillary     Status: Abnormal   Collection Time: 09/24/22 12:36 PM  Result Value Ref Range   Glucose-Capillary 220 (H) 70 - 99 mg/dL  Glucose, capillary     Status: Abnormal   Collection Time: 09/24/22  2:36 PM  Result Value Ref Range   Glucose-Capillary 155 (H) 70 - 99 mg/dL  Glucose, capillary     Status: Abnormal   Collection Time: 09/24/22  5:13 PM  Result Value Ref Range   Glucose-Capillary 108 (H) 70 - 99 mg/dL  Glucose, capillary     Status: None   Collection Time: 09/24/22  7:19 PM  Result Value Ref Range   Glucose-Capillary 94 70 - 99 mg/dL  Glucose, capillary     Status: None   Collection Time: 09/24/22  9:41 PM  Result Value Ref Range   Glucose-Capillary 97 70 - 99 mg/dL    Assessment / Plan: [redacted]w[redacted]d week IUP Labor: Early/IOL on low-dose pitocin. Foley bulb now placed. Fetal Wellbeing:  Category I Pain Control:  IV pain meds Anticipated MOD:  SVD TOLAC: Progressing normally on pitocin  [place], [place], CNM 09/24/2022 6:35 PM

## 2022-09-24 NOTE — H&P (Cosign Needed Addendum)
OBSTETRIC ADMISSION HISTORY AND PHYSICAL  Sabrina Dixon is a 22 y.o. female G2P1001 with IUP at [redacted]w[redacted]d by LMP presenting for IOL for GDMA2 on Met and cHTN. She reports +FMs, No LOF, no VB, no blurry vision, headaches or peripheral edema, and RUQ pain.  She plans on breast feeding. She request nexplanon for birth control. She received her prenatal care at  [facility]    Dating: By LMP --->  Estimated Date of Delivery: 10/02/22  Sono:   @[redacted]w[redacted]d , CWD, normal anatomy, presentation  presentation, 1857 gm 31 % EFW       Nursing Staff Provider  Office Location [facility] Dating  10/02/2022, by Last Menstrual Period  [facility] Model [ ]  Traditional [ x] Centering [ ]  Mom-Baby Dyad      Language  English Anatomy US     Flu Vaccine    Genetic/Carrier Screen  NIPS:   LR female AFP:  negative  Horizon:alpha thalassemia silent carrier  TDaP Vaccine   07/31/22 Hgb A1C or  GTT Early  Third trimester   COVID Vaccine  No   LAB RESULTS   Rhogam   NA Blood Type O/Positive/-- (02/15 1107)   Baby Feeding Plan  Breast Antibody Negative (02/15 1107)  Contraception  Nexplanon  Rubella 2.02 (02/15 1107)  Circumcision  Yes, IP RPR Non Reactive (02/15 1107)   Pediatrician   [place] HBsAg Negative (02/15 1107)   Support Person  FOB HCVAb Non Reactive (02/15 1107)   Prenatal Classes  NA HIV Non Reactive (02/15 1107)     BTL Consent  NA GBS (For PCN allergy, check sensitivities)   VBAC Consent   Pap  N/A due to age            DME Rx [ x] BP cuff- has cuff Arly.Keller ] Weight Scale Waterbirth  [ ]  Class [ ]  Consent [ ]  CNM visit  PHQ9 & GAD7 [  ] new OB [  ] 28 weeks  [  ] 36 weeks Induction  [ ]  Orders Entered [ ] Foley Y/N   Prenatal History/Complications:  Gestational diabetes managed with Metformin Chronic hypertension- on procardia Hx of STIs Hx of severe Pre-E  Past Medical History: Past Medical History:  Diagnosis Date   Asthma    Chlamydia    GC (gonococcus)    Gestational diabetes    History of  gestational diabetes mellitus (GDM) 2023   Hypertension    Vaginal bleeding in pregnancy, first trimester 03/02/2022    Past Surgical History: Past Surgical History:  Procedure Laterality Date   CESAREAN SECTION N/A 03/30/2021   Procedure: CESAREAN SECTION;  Surgeon: Adam Phenix, MD;  Location: [facility] LD ORS;  Service: Obstetrics;  Laterality: N/A;    Obstetrical History: OB History     Gravida  2   Para  1   Term  1   Preterm      AB      Living  1      SAB      IAB      Ectopic      Multiple  0   Live Births  1           Prior delivery 2023- PLTCS (non reassuring fetal heart tones, arrest of dilation)   Social History Social History   Socioeconomic History   Marital status: Single    Spouse name: Not on file   Number of children: Not on file   Years of education: Not on  file   Highest education level: Not on file  Occupational History   Not on file  Tobacco Use   Smoking status: Never   Smokeless tobacco: Never  Vaping Use   Vaping status: Former   Quit date: 02/01/2021   Substances: Flavoring  Substance and Sexual Activity   Alcohol use: Not Currently    Comment: occasionally   Drug use: Not Currently    Types: Marijuana    Comment: last marijuana on 1/1   Sexual activity: Yes    Birth control/protection: None    Comment: undecided for postpartum  Other Topics Concern   Not on file  Social History Narrative   Not on file   Social Determinants of Health   Financial Resource Strain: Not on file  Food Insecurity: No Food Insecurity (09/24/2022)   Hunger Vital Sign    Worried About Running Out of Food in the Last Year: Never true    Ran Out of Food in the Last Year: Never true  Transportation Needs: No Transportation Needs (09/24/2022)   PRAPARE - Administrator, Civil Service (Medical): No    Lack of Transportation (Non-Medical): No  Physical Activity: Not on file  Stress: Not on file  Social Connections: Not on file     Family History: Family History  Problem Relation Age of Onset   Hypertension Mother    Cancer Neg Hx    Diabetes Neg Hx     Allergies: No Known Allergies  Medications Prior to Admission  Medication Sig Dispense Refill Last Dose   Accu-Chek Softclix Lancets lancets Use as instructed 100 each 12 09/24/2022   aspirin EC 81 MG tablet Take 2 tablets (162 mg total) by mouth daily. Swallow whole. 60 tablet 5 09/24/2022   Blood Glucose Monitoring Suppl (ACCU-CHEK GUIDE) w/Device KIT 1 Device by Does not apply route as needed. 1 kit 0 09/24/2022   glucose blood (ACCU-CHEK GUIDE) test strip Use as instructed 100 each 12 09/24/2022   metFORMIN (GLUCOPHAGE) 500 MG tablet Take 1 tablet (500 mg total) by mouth 2 (two) times daily with a meal. 180 tablet 1 09/24/2022   NIFEdipine (ADALAT CC) 90 MG 24 hr tablet Take 1 tablet (90 mg total) by mouth daily. 90 tablet 3 09/24/2022   prenatal vitamin w/FE, FA (NATACHEW) 29-1 MG CHEW chewable tablet Chew 1 tablet by mouth daily at 12 noon. 30 tablet 11 09/24/2022     Review of Systems  All systems reviewed and negative except as stated in HPI  Blood pressure (!) 146/85, pulse (!) 54, temperature 97.7 F (36.5 C), temperature source Oral, resp. rate 17, height 5' (1.524 m), weight 96.8 kg, last menstrual period 12/26/2021, unknown if currently breastfeeding. General appearance: alert and no distress, well appearing Lungs: NWOB on RA  Heart: regular rate and rhythm Abdomen: soft, non-tender; bowel sounds normal Extremities: no sign of DVT  Presentation: cephalic Fetal monitoringBaseline: 130 bpm, Variability: Good {> 6 bpm), Accelerations: Reactive, and Decelerations: Absent Uterine activity: mild UC, 2-4 mins apart  Dilation: 1 Effacement (%): 50 Station: -3 Exam by:: Ivonne Andrew, CNM   Prenatal labs: ABO, Rh: --/--/O POS (08/25 1204) Antibody: NEG (08/25 1204) Rubella: 2.02 (02/15 1107) RPR: NON REACTIVE (08/25 1204)  HBsAg: Negative (02/15 1107)   HIV: Non Reactive (06/03 0950)  GBS: Negative/-- (08/22 0103)  1 hr Glucola FAILED- GDMA2 on Metformin Genetic screening  Low risk, carrier for alpha thal  Anatomy US normal, mild polyhydramnios   Prenatal Transfer Tool  Maternal Diabetes: Yes:  Diabetes Type:  Insulin/Medication controlled Genetic Screening: Abnormal:  Results: Other: alpha thal silent carrier  Maternal Ultrasounds/Referrals: Normal, mild polyhydramnios  Fetal Ultrasounds or other Referrals:  Referred to Materal Fetal Medicine  Maternal Substance Abuse:  Yes:  Type: Marijuana previously  Significant Maternal Medications:  Meds include: Other: Procardia XL 90, Metformin 500 BID Significant Maternal Lab Results:  Group B Strep negative, chlamydia positive  Number of Prenatal Visits:greater than 3 verified prenatal visits Other Comments:  None  Results for orders placed or performed during the hospital encounter of 09/24/22 (from the past 24 hour(s))  CBC   Collection Time: 09/24/22 12:04 PM  Result Value Ref Range   WBC 6.9 4.0 - 10.5 K/uL   RBC 4.51 3.87 - 5.11 MIL/uL   Hemoglobin 9.4 (L) 12.0 - 15.0 g/dL   HCT 26.9 (L) 48.5 - 46.2 %   MCV 68.1 (L) 80.0 - 100.0 fL   MCH 20.8 (L) 26.0 - 34.0 pg   MCHC 30.6 30.0 - 36.0 g/dL   RDW 70.3 (H) 50.0 - 93.8 %   Platelets 156 150 - 400 K/uL   nRBC 0.0 0.0 - 0.2 %  RPR   Collection Time: 09/24/22 12:04 PM  Result Value Ref Range   RPR Ser Ql NON REACTIVE NON REACTIVE  Comprehensive metabolic panel   Collection Time: 09/24/22 12:04 PM  Result Value Ref Range   Sodium 136 135 - 145 mmol/L   Potassium 3.6 3.5 - 5.1 mmol/L   Chloride 106 98 - 111 mmol/L   CO2 17 (L) 22 - 32 mmol/L   Glucose, Bld 234 (H) 70 - 99 mg/dL   BUN 7 6 - 20 mg/dL   Creatinine, Ser 1.82 0.44 - 1.00 mg/dL   Calcium 8.4 (L) 8.9 - 10.3 mg/dL   Total Protein 5.7 (L) 6.5 - 8.1 g/dL   Albumin 2.5 (L) 3.5 - 5.0 g/dL   AST 13 (L) 15 - 41 U/L   ALT 9 0 - 44 U/L   Alkaline Phosphatase 128 (H) 38 -  126 U/L   Total Bilirubin 0.6 0.3 - 1.2 mg/dL   GFR, Estimated >99 >37 mL/min   Anion gap 13 5 - 15  Protein / creatinine ratio, urine   Collection Time: 09/24/22 12:04 PM  Result Value Ref Range   Creatinine, Urine 63 mg/dL   Total Protein, Urine 12 mg/dL   Protein Creatinine Ratio 0.19 (H) 0.00 - 0.15 mg/mg[Cre]  Type and screen   Collection Time: 09/24/22 12:04 PM  Result Value Ref Range   ABO/RH(D) O POS    Antibody Screen NEG    Sample Expiration      09/27/2022,2359 Performed at [facility], [address] [place], [place] [address]   Glucose, capillary   Collection Time: 09/24/22 12:36 PM  Result Value Ref Range   Glucose-Capillary 220 (H) 70 - 99 mg/dL  Glucose, capillary   Collection Time: 09/24/22  2:36 PM  Result Value Ref Range   Glucose-Capillary 155 (H) 70 - 99 mg/dL  Glucose, capillary   Collection Time: 09/24/22  5:13 PM  Result Value Ref Range   Glucose-Capillary 108 (H) 70 - 99 mg/dL    Patient Active Problem List   Diagnosis Date Noted   Chronic hypertension during pregnancy, antepartum 09/24/2022   Short interval between pregnancies affecting pregnancy in third trimester, antepartum 09/21/2022   Gestational diabetes 07/28/2022   Anemia affecting pregnancy in third trimester 07/04/2022   Elevated  hemoglobin A1c 06/06/2022   Chronic hypertension 05/09/2022   Alpha thalassemia silent carrier 03/29/2022   Supervision of high-risk pregnancy 03/02/2022   History of severe pre-eclampsia 03/02/2022   Obesity affecting pregnancy 03/02/2022   History of C-section 03/02/2022    Assessment/Plan:  Sabrina Dixon is a 22 y.o. G2P1001 at [redacted]w[redacted]d here for IOL for GDMA2 and cHTN. Patient desires TOLAC.   #Labor:expectant management #Pain:  as per patient #FWB: Cat. I #ID:     GBS neg, Chlamydia positive- received azithromycin  #MOF: breast #MOC: nexplanon #Circ:   Yes    Hal Morales, MD  09/24/2022, 7:00 PM   I performed the exam and agree with  above.  Katrinka Blazing, [place], [place] 09/24/2022 10:31 PM

## 2022-09-24 NOTE — Addendum Note (Signed)
Addended by: Geronimo Bing on: 09/24/2022 10:05 PM   Modules accepted: Orders

## 2022-09-25 ENCOUNTER — Inpatient Hospital Stay (HOSPITAL_COMMUNITY): Payer: Medicaid Other | Admitting: Anesthesiology

## 2022-09-25 ENCOUNTER — Encounter (HOSPITAL_COMMUNITY)
Admission: RE | Disposition: A | Discharge status: Home / Self Care | Payer: Self-pay | Source: Home / Self Care | Attending: Obstetrics and Gynecology

## 2022-09-25 ENCOUNTER — Other Ambulatory Visit: Payer: Self-pay

## 2022-09-25 ENCOUNTER — Encounter (HOSPITAL_COMMUNITY): Payer: Self-pay | Admitting: Obstetrics and Gynecology

## 2022-09-25 DIAGNOSIS — Z3A38 38 weeks gestation of pregnancy: Secondary | ICD-10-CM

## 2022-09-25 DIAGNOSIS — O24424 Gestational diabetes mellitus in childbirth, insulin controlled: Secondary | ICD-10-CM

## 2022-09-25 DIAGNOSIS — O403XX0 Polyhydramnios, third trimester, not applicable or unspecified: Secondary | ICD-10-CM

## 2022-09-25 DIAGNOSIS — O1002 Pre-existing essential hypertension complicating childbirth: Secondary | ICD-10-CM

## 2022-09-25 DIAGNOSIS — O99324 Drug use complicating childbirth: Secondary | ICD-10-CM

## 2022-09-25 DIAGNOSIS — Z3A39 39 weeks gestation of pregnancy: Secondary | ICD-10-CM

## 2022-09-25 LAB — CBC
HCT: 33.7 % — ABNORMAL LOW (ref 36.0–46.0)
Hemoglobin: 10.3 g/dL — ABNORMAL LOW (ref 12.0–15.0)
MCH: 21.3 pg — ABNORMAL LOW (ref 26.0–34.0)
MCHC: 30.6 g/dL (ref 30.0–36.0)
MCV: 69.6 fL — ABNORMAL LOW (ref 80.0–100.0)
Platelets: 152 10*3/uL (ref 150–400)
RBC: 4.84 MIL/uL (ref 3.87–5.11)
RDW: 21.3 % — ABNORMAL HIGH (ref 11.5–15.5)
WBC: 8.1 10*3/uL (ref 4.0–10.5)
nRBC: 0 % (ref 0.0–0.2)

## 2022-09-25 LAB — GLUCOSE, CAPILLARY
Glucose-Capillary: 104 mg/dL — ABNORMAL HIGH (ref 70–99)
Glucose-Capillary: 108 mg/dL — ABNORMAL HIGH (ref 70–99)
Glucose-Capillary: 111 mg/dL — ABNORMAL HIGH (ref 70–99)
Glucose-Capillary: 113 mg/dL — ABNORMAL HIGH (ref 70–99)
Glucose-Capillary: 113 mg/dL — ABNORMAL HIGH (ref 70–99)
Glucose-Capillary: 115 mg/dL — ABNORMAL HIGH (ref 70–99)
Glucose-Capillary: 117 mg/dL — ABNORMAL HIGH (ref 70–99)
Glucose-Capillary: 119 mg/dL — ABNORMAL HIGH (ref 70–99)
Glucose-Capillary: 129 mg/dL — ABNORMAL HIGH (ref 70–99)
Glucose-Capillary: 131 mg/dL — ABNORMAL HIGH (ref 70–99)
Glucose-Capillary: 70 mg/dL (ref 70–99)
Glucose-Capillary: 71 mg/dL (ref 70–99)
Glucose-Capillary: 97 mg/dL (ref 70–99)

## 2022-09-25 SURGERY — Surgical Case
Anesthesia: Epidural

## 2022-09-25 MED ORDER — CEFAZOLIN SODIUM-DEXTROSE 2-3 GM-%(50ML) IV SOLR
INTRAVENOUS | Status: DC | PRN
Start: 2022-09-25 — End: 2022-09-25
  Administered 2022-09-25 (×2): 2 g via INTRAVENOUS

## 2022-09-25 MED ORDER — EPHEDRINE 5 MG/ML INJ: 10.0000 mg | INTRAVENOUS | Status: DC | PRN

## 2022-09-25 MED ORDER — OXYTOCIN-SODIUM CHLORIDE 30-0.9 UT/500ML-% IV SOLN
INTRAVENOUS | Status: DC | PRN
  Administered 2022-09-25: 30 [IU] via INTRAVENOUS

## 2022-09-25 MED ORDER — DIPHENHYDRAMINE HCL 50 MG/ML IJ SOLN: 12.5000 mg | INTRAMUSCULAR | Status: DC | PRN

## 2022-09-25 MED ORDER — DEXAMETHASONE SODIUM PHOSPHATE 10 MG/ML IJ SOLN
INTRAMUSCULAR | Status: AC
  Filled 2022-09-25: qty 1

## 2022-09-25 MED ORDER — PHENYLEPHRINE 80 MCG/ML (10ML) SYRINGE FOR IV PUSH (FOR BLOOD PRESSURE SUPPORT)
80.0000 ug | PREFILLED_SYRINGE | INTRAVENOUS | Status: DC | PRN
  Filled 2022-09-25: qty 10

## 2022-09-25 MED ORDER — LIDOCAINE-EPINEPHRINE (PF) 2 %-1:200000 IJ SOLN
INTRAMUSCULAR | Status: DC | PRN
  Administered 2022-09-25 (×3): 5 mL via EPIDURAL

## 2022-09-25 MED ORDER — DEXAMETHASONE SODIUM PHOSPHATE 10 MG/ML IJ SOLN
INTRAMUSCULAR | Status: DC | PRN
  Administered 2022-09-25: 10 mg via INTRAVENOUS

## 2022-09-25 MED ORDER — ACETAMINOPHEN 10 MG/ML IV SOLN
1000.0000 mg | Freq: Once | INTRAVENOUS | Status: DC | PRN
  Administered 2022-09-25: 1000 mg via INTRAVENOUS

## 2022-09-25 MED ORDER — TRANEXAMIC ACID-NACL 1000-0.7 MG/100ML-% IV SOLN
INTRAVENOUS | Status: DC | PRN
  Administered 2022-09-25: 1000 mg via INTRAVENOUS

## 2022-09-25 MED ORDER — TRANEXAMIC ACID-NACL 1000-0.7 MG/100ML-% IV SOLN
INTRAVENOUS | Status: AC
  Filled 2022-09-25: qty 100

## 2022-09-25 MED ORDER — FENTANYL CITRATE (PF) 100 MCG/2ML IJ SOLN: 25.0000 ug | INTRAMUSCULAR | Status: DC | PRN

## 2022-09-25 MED ORDER — LIDOCAINE HCL (PF) 1 % IJ SOLN
INTRAMUSCULAR | Status: DC | PRN
  Administered 2022-09-25: 3 mL via EPIDURAL
  Administered 2022-09-25: 2 mL via EPIDURAL
  Administered 2022-09-25: 5 mL via EPIDURAL

## 2022-09-25 MED ORDER — LACTATED RINGERS IV SOLN
500.0000 mL | Freq: Once | INTRAVENOUS | Status: AC
  Administered 2022-09-25: 500 mL via INTRAVENOUS

## 2022-09-25 MED ORDER — OXYCODONE HCL 5 MG PO TABS: 5.0000 mg | ORAL_TABLET | Freq: Once | ORAL | Status: DC | PRN

## 2022-09-25 MED ORDER — FENTANYL CITRATE (PF) 100 MCG/2ML IJ SOLN
INTRAMUSCULAR | Status: AC
  Filled 2022-09-25: qty 2

## 2022-09-25 MED ORDER — ONDANSETRON HCL 4 MG/2ML IJ SOLN
INTRAMUSCULAR | Status: AC
  Filled 2022-09-25: qty 2

## 2022-09-25 MED ORDER — FENTANYL-BUPIVACAINE-NACL 0.5-0.125-0.9 MG/250ML-% EP SOLN
12.0000 mL/h | EPIDURAL | Status: DC | PRN
  Administered 2022-09-25: 12 mL/h via EPIDURAL
  Filled 2022-09-25: qty 250

## 2022-09-25 MED ORDER — SCOPOLAMINE 1 MG/3DAYS TD PT72
MEDICATED_PATCH | TRANSDERMAL | Status: AC
  Filled 2022-09-25: qty 1

## 2022-09-25 MED ORDER — PHENYLEPHRINE 80 MCG/ML (10ML) SYRINGE FOR IV PUSH (FOR BLOOD PRESSURE SUPPORT): 80.0000 ug | PREFILLED_SYRINGE | INTRAVENOUS | Status: DC | PRN

## 2022-09-25 MED ORDER — ONDANSETRON HCL 4 MG/2ML IJ SOLN
INTRAMUSCULAR | Status: DC | PRN
  Administered 2022-09-25: 4 mg via INTRAVENOUS

## 2022-09-25 MED ORDER — SODIUM CHLORIDE 0.9 % IR SOLN
Status: DC | PRN
  Administered 2022-09-25: 1

## 2022-09-25 MED ORDER — CEFAZOLIN SODIUM-DEXTROSE 2-4 GM/100ML-% IV SOLN
INTRAVENOUS | Status: AC
  Filled 2022-09-25: qty 100

## 2022-09-25 MED ORDER — FENTANYL CITRATE (PF) 100 MCG/2ML IJ SOLN
INTRAMUSCULAR | Status: DC | PRN
  Administered 2022-09-25: 100 ug via EPIDURAL

## 2022-09-25 MED ORDER — OXYCODONE HCL 5 MG/5ML PO SOLN: 5.0000 mg | Freq: Once | ORAL | Status: DC | PRN

## 2022-09-25 MED ORDER — MORPHINE SULFATE (PF) 0.5 MG/ML IJ SOLN
INTRAMUSCULAR | Status: AC
  Filled 2022-09-25: qty 10

## 2022-09-25 MED ORDER — PHENYLEPHRINE HCL (PRESSORS) 10 MG/ML IV SOLN
INTRAVENOUS | Status: DC | PRN
  Administered 2022-09-25 (×3): 160 ug via INTRAVENOUS
  Administered 2022-09-25: 80 ug via INTRAVENOUS

## 2022-09-25 MED ORDER — KETOROLAC TROMETHAMINE 30 MG/ML IJ SOLN: 30.0000 mg | Freq: Once | INTRAMUSCULAR | Status: AC | PRN

## 2022-09-25 MED ORDER — MORPHINE SULFATE (PF) 0.5 MG/ML IJ SOLN
INTRAMUSCULAR | Status: DC | PRN
  Administered 2022-09-25: 3 mg via EPIDURAL

## 2022-09-25 MED ORDER — ACETAMINOPHEN 10 MG/ML IV SOLN
INTRAVENOUS | Status: AC
  Filled 2022-09-25: qty 100

## 2022-09-25 MED ORDER — SODIUM CHLORIDE 0.9 % IV SOLN
INTRAVENOUS | Status: DC | PRN
  Administered 2022-09-25: 500 mg via INTRAVENOUS

## 2022-09-25 MED ORDER — LACTATED RINGERS AMNIOINFUSION
INTRAVENOUS | Status: DC
  Administered 2022-09-25: 250 mL via INTRAUTERINE

## 2022-09-25 MED ORDER — PROMETHAZINE HCL 25 MG/ML IJ SOLN: 6.2500 mg | INTRAMUSCULAR | Status: DC | PRN

## 2022-09-25 SURGICAL SUPPLY — 33 items
APL PRP STRL LF DISP 70% ISPRP (MISCELLANEOUS) ×2
APL SKNCLS STERI-STRIP NONHPOA (GAUZE/BANDAGES/DRESSINGS) ×1
BENZOIN TINCTURE PRP APPL 2/3 (GAUZE/BANDAGES/DRESSINGS) ×1 IMPLANT
CANISTER SUCT 3000ML PPV (MISCELLANEOUS) ×1 IMPLANT
CHLORAPREP W/TINT 26 (MISCELLANEOUS) ×2 IMPLANT
CLAMP UMBILICAL CORD (MISCELLANEOUS) ×1 IMPLANT
DRSG OPSITE POSTOP 4X10 (GAUZE/BANDAGES/DRESSINGS) ×1 IMPLANT
ELECT REM PT RETURN 9FT ADLT (ELECTROSURGICAL) ×1
ELECTRODE REM PT RTRN 9FT ADLT (ELECTROSURGICAL) ×1 IMPLANT
EXTRACTOR VACUUM KIWI (MISCELLANEOUS) ×1 IMPLANT
GAUZE PAD ABD 8X10 STRL (GAUZE/BANDAGES/DRESSINGS) IMPLANT
GAUZE SPONGE 4X4 12PLY STRL LF (GAUZE/BANDAGES/DRESSINGS) IMPLANT
GLOVE BIOGEL PI IND STRL 7.0 (GLOVE) ×2 IMPLANT
GLOVE BIOGEL PI IND STRL 7.5 (GLOVE) ×1 IMPLANT
GLOVE SURG SS PI 7.0 STRL IVOR (GLOVE) ×1 IMPLANT
GOWN STRL REUS W/ TWL LRG LVL3 (GOWN DISPOSABLE) ×2 IMPLANT
GOWN STRL REUS W/ TWL XL LVL3 (GOWN DISPOSABLE) ×1 IMPLANT
GOWN STRL REUS W/TWL LRG LVL3 (GOWN DISPOSABLE) ×2
GOWN STRL REUS W/TWL XL LVL3 (GOWN DISPOSABLE) ×1
NS IRRIG 1000ML POUR BTL (IV SOLUTION) ×1 IMPLANT
PACK C SECTION WH (CUSTOM PROCEDURE TRAY) ×1 IMPLANT
PAD ABD 7.5X8 STRL (GAUZE/BANDAGES/DRESSINGS) ×1 IMPLANT
PAD OB MATERNITY 4.3X12.25 (PERSONAL CARE ITEMS) ×1 IMPLANT
PAD PREP 24X48 CUFFED NSTRL (MISCELLANEOUS) ×1 IMPLANT
STRIP CLOSURE SKIN 1/2X4 (GAUZE/BANDAGES/DRESSINGS) ×1 IMPLANT
SUT MNCRL 0 VIOLET CTX 36 (SUTURE) ×2 IMPLANT
SUT MON AB 4-0 PS1 27 (SUTURE) ×1 IMPLANT
SUT PLAIN 2 0 XLH (SUTURE) ×1 IMPLANT
SUT VIC AB 0 CT1 36 (SUTURE) ×2 IMPLANT
SUT VIC AB 3-0 CT1 27 (SUTURE) ×1
SUT VIC AB 3-0 CT1 TAPERPNT 27 (SUTURE) ×1 IMPLANT
TOWEL OR 17X24 6PK STRL BLUE (TOWEL DISPOSABLE) ×2 IMPLANT
WATER STERILE IRR 1000ML POUR (IV SOLUTION) ×1 IMPLANT

## 2022-09-25 NOTE — Progress Notes (Signed)
LABOR PROGRESS NOTE  Sabrina Dixon is a 22 y.o. G2P1001 at [redacted]w[redacted]d presented for IOL for A2GDM on metformin, cHTN on procardia. TOLAC.  S: Resting.  O:  BP (!) 140/58   Pulse (!) 54   Temp 97.7 F (36.5 C) (Oral)   Resp 17   Ht 5' (1.524 m)   Wt 96.8 kg   LMP 12/26/2021 (Exact Date)   BMI 41.70 kg/m  EFM:125 bpm/Moderate variability/ 15x15 accels/ None decels CAT: 1 Toco: irregular, every 2-4 minutes   CVE: Dilation: 4 Effacement (%): 50 Cervical Position: Anterior Station: Ballotable Presentation: Vertex Exam by:: Dr. Earlene Plater   A&P: 22 y.o. [id number] [redacted]w[redacted]d here for IOL as above  #Labor: S/p FB. Start to titrate pitocin. AROM when able #Pain: IV #FWB: CAT 1 #GBS negative  #A2GDM: Continue metformin 500 BID. Q2 glucose with SSI. Low threshold for endotool #cHTN: Continue procardia 90  Joanne Gavel, MD FMOB Fellow, Faculty practice [facility], Center for [facility] 09/25/22  3:07 AM

## 2022-09-25 NOTE — Transfer of Care (Signed)
Immediate Anesthesia Transfer of Care Note  Patient: Sabrina Dixon  Procedure(s) Performed: CESAREAN SECTION  Patient Location: PACU  Anesthesia Type:Epidural  Level of Consciousness: awake, alert , and oriented  Airway & Oxygen Therapy: Patient Spontanous Breathing  Post-op Assessment: Report given to RN and Post -op Vital signs reviewed and stable  Post vital signs: Reviewed and stable  Last Vitals:  Vitals Value Taken Time  BP 137/99 09/25/22 2333  Temp 36.7 C 09/25/22 2333  Pulse 108 09/25/22 2338  Resp 16 09/25/22 2338  SpO2 99 % 09/25/22 2338  Vitals shown include unfiled device data.  Last Pain:  Vitals:   09/25/22 2333  TempSrc: Oral  PainSc:          Complications: No notable events documented.

## 2022-09-25 NOTE — Progress Notes (Signed)
Labor Progress Note Sabrina Dixon is a 22 y.o. 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 S: Sleepy at bedside, but no acute complaints. Comfortable with epidural.  O:  BP 131/80   Pulse 67   Temp 97.7 F (36.5 C) (Oral)   Resp 17   Ht 5' (1.524 m)   Wt 96.8 kg   LMP 12/26/2021 (Exact Date)   SpO2 100%   BMI 41.70 kg/m  EFM: 125/mod/+a/-d  CVE: Dilation: 5 Effacement (%): 70 Cervical Position: Anterior Station: -2 Presentation: Vertex Exam by:: Dr. Lucianne Muss   A&P: 22 y.o. G2P1001 [redacted]w[redacted]d here for IOL 2/2 A2GDM and cHTN.  #Labor: Cervix unchanged, but lower station  IUPC placed to better eval contractions, if not adequate, would consider pit break then restart #Pain: Epidural #FWB: Cat II strip  +accel with scalp stim #GBS negative  #A2GDM: BG of 130 on last check, requiring SSI  low threshold to start endo tool  EFW 31`%, AC 36% at 32wk  #cHTN: BP ok  cont Procardia    Sundra Aland, MD 9:45 AM

## 2022-09-25 NOTE — Progress Notes (Signed)
LABOR PROGRESS NOTE  Sabrina Dixon is a 22 y.o. G2P1001 at [redacted]w[redacted]d presented for IOL for A2GDM on metformin, cHTN on procardia. TOLAC.  S: Starting to feel painful ctx.  O:  BP 133/80   Pulse 75   Temp 97.7 F (36.5 C) (Oral)   Resp 17   Ht 5' (1.524 m)   Wt 96.8 kg   LMP 12/26/2021 (Exact Date)   BMI 41.70 kg/m  EFM:125 bpm/Moderate variability/ 15x15 accels/ None decels CAT: 1 Toco: regular, every ~2 minutes   CVE: Dilation: 5 Effacement (%): 80 Cervical Position: Anterior Station: -2 Presentation: Vertex Exam by:: Dr. Earlene Plater   A&P: 22 y.o. G2P1001 [redacted]w[redacted]d here for IOL as above  #Labor: AROM performed once verbal consent obtained from pt after discussion of risks/benefits. Clear fluid. Continue to titrate pitocin #Pain: IV, planning epidural #FWB: CAT 1 #GBS negative  #A2GDM: Continue metformin 500 BID. Q2 glucose with SSI. Low threshold for endotool #cHTN: Continue procardia 90  Joanne Gavel, MD FMOB Fellow, Faculty practice [facility], Center for [facility] 09/25/22  6:26 AM

## 2022-09-25 NOTE — Progress Notes (Signed)
Labor Progress Note Sabrina Dixon is a 23 y.o. 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 S: No complaints at this time.  O:  BP 122/77   Pulse 60   Temp 97.7 F (36.5 C) (Oral)   Resp 17   Ht 5' (1.524 m)   Wt 96.8 kg   LMP 12/26/2021 (Exact Date)   SpO2 100%   BMI 41.70 kg/m  EFM: 120/mod/+a/-d  CVE: Dilation: 5 Effacement (%): 70 Cervical Position: Anterior Station: -2 Presentation: Vertex Exam by:: Ileana Ladd, RN   A&P: 22 y.o. G2P1001 [redacted]w[redacted]d here for IOL 2/2 A2GDM and cHTN.  #Labor: Cervix unchanged, head more well engaged per bedside RN  plan for pit break at this time #Pain: Epidural #FWB: Cat II strip, cont to monitor #GBS negative  #A2GDM: BG of 130 on last check, requiring SSI  low threshold to start endo tool  EFW 31`%, AC 36% at 32wk  #cHTN: BP ok  cont Procardia    Sundra Aland, MD 12:55 PM

## 2022-09-25 NOTE — Anesthesia Procedure Notes (Signed)
Epidural Patient location during procedure: OB Start time: 09/25/2022 7:20 AM End time: 09/25/2022 7:27 AM  Staffing Anesthesiologist: Collene Schlichter, MD Performed: anesthesiologist   Preanesthetic Checklist Completed: patient identified, IV checked, risks and benefits discussed, monitors and equipment checked, pre-op evaluation and timeout performed  Epidural Patient position: sitting Prep: DuraPrep Patient monitoring: blood pressure and continuous pulse ox Approach: midline Location: L3-L4 Injection technique: LOR air  Needle:  Needle type: Tuohy  Needle gauge: 17 G Needle length: 9 cm Needle insertion depth: 8 cm Catheter size: 19 Gauge Catheter at skin depth: 14 cm Test dose: negative and Other (1% Lidocaine)  Additional Notes Patient identified.  Risk benefits discussed including failed block, incomplete pain control, headache, nerve damage, paralysis, blood pressure changes, nausea, vomiting, reactions to medication both toxic or allergic, and postpartum back pain.  Patient expressed understanding and wished to proceed.  All questions were answered.  Sterile technique used throughout procedure and epidural site dressed with sterile barrier dressing. No paresthesia or other complications noted. The patient did not experience any signs of intravascular injection such as tinnitus or metallic taste in mouth nor signs of intrathecal spread such as rapid motor block. Please see nursing notes for vital signs. Reason for block:procedure for pain

## 2022-09-25 NOTE — Op Note (Signed)
Operative Note   SURGERY DATE: 09/25/2022  PRE-OP DIAGNOSIS:  *Pregnancy at 39/0 *Arrest of dilation at 7-8cm *Failed IOL for CHTN and GDMA2 *BMI 40s  POST-OP DIAGNOSIS: Same. Delivered   PROCEDURE: Repeat low transverse cesarean section via pfannenstiel skin incision with singl layer uterine closure  SURGEON: Surgeons and Role:    * Harcourt Bing, MD - Primary  ASSISTANT:    Joanne Gavel, MD - Fellow  An experienced assistant was required given the standard of surgical care given the complexity of the case.  This assistant was needed for exposure, dissection, suctioning, retraction, instrument exchange, assisting with delivery with administration of fundal pressure, and for overall help during the procedure.  ANESTHESIA: spinal  ESTIMATED BLOOD LOSS:   DRAINS: UOP via indwelling foley  TOTAL IV FLUIDS: crystalloid  VTE PROPHYLAXIS: SCDs to bilateral lower extremities  ANTIBIOTICS: Two grams of Cefazolin were given., within 1 hour of skin incision and azithromycin 500mg  IV given intraoperatively  SPECIMENS: placenta to pathology  COMPLICATIONS: none  FINDINGS: No intra-abdominal adhesions were noted. Grossly normal uterus, tubes and ovaries. Clear amniotic fluid, cephalic, large caput, direct OP, female infant, weight 3147gm, APGARs 8/8/9, intact placenta.  PROCEDURE IN DETAIL: The patient was taken to the operating room where anesthesia was administered and normal fetal heart tones were confirmed. She was then prepped and draped in the normal fashion in the dorsal supine position with a leftward tilt.  After a time out was performed, a pfannensteil skin incision was made with the scalpel and carried through to the underlying layer of fascia. The fascia was then incised at the midline and this incision was extended laterally with the mayo scissors. Attention was turned to the superior aspect of the fascial incision which was grasped with the kocher  clamps x 2, tented up and the rectus muscles were dissected off with the scalpel. In a similar fashion the inferior aspect of the fascial incision was grasped with the kocher clamps, tented up and the rectus muscles dissected off with the mayo scissors. The rectus muscles were then separated in the midline and the peritoneum was entered bluntly. The bladder blade was inserted and the vesicouterine peritoneum was identified, tented up and entered with the metzenbaum scissors. This incision was extended laterally and the bladder flap was created digitally. The bladder blade was reinserted.  A low transverse hysterotomy was made with the scalpel until the endometrial cavity was breached and the amniotic sac ruptured, yielding clear amniotic fluid. This incision was extended bluntly and the infant's head, shoulders and body were delivered atraumatically.The cord was clamped x 2 and cut, and the infant was handed to the awaiting pediatricians, after delayed cord clamping was not done because baby wasn't vigorous until cord was cut.  The placenta was then gradually expressed from the uterus and then the uterus was exteriorized and cleared of all clots and debris. The hysterotomy was repaired with a running suture of 1-0 monocryl to achieve excellent hemostasis.  The uterus and adnexa were then returned to the abdomen, and the hysterotomy and all operative sites were reinspected and excellent hemostasis was noted after irrigation and suction of the abdomen with warm saline.  The peritoneum was closed with a running stitch of 3-0 Vicryl. The fascia was reapproximated with 0 Vicryl in a simple running fashion bilaterally. The subcutaneous layer was then reapproximated with interrupted sutures of 2-0 plain gut, and the skin was then closed with 4-0 monocryl, in a subcuticular fashion.  The patient  tolerated the procedure well. Sponge, lap, needle, and instrument counts were correct x 2. The patient was transferred to  the recovery room awake, alert and breathing independently in stable condition.  Cornelia Copa MD Attending Center for [facility])

## 2022-09-25 NOTE — Progress Notes (Signed)
OB Note Sve unchagnged at 7/80/-2/ and with caput. Category II with worsening variables with IUPC and AI going. D/w her and recommend c/s for arrest of dilation given she's been nearly unchanged for at least 4 hours. Risk of c-section d/w her (infection, bleeding, damage to surrounding structures) and patient amenable to proceeding with repeat c/s  Cornelia Copa MD Attending Center for [facility] Healthcare (Faculty Practice) 09/25/2022 Time: 2145

## 2022-09-25 NOTE — Progress Notes (Signed)
Labor Progress Note Sabrina Dixon is a 22 y.o. 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  S: No complaints   O:  BP 128/63   Pulse 71   Temp 98.7 F (37.1 C) (Axillary)   Resp 17   Ht 5' (1.524 m)   Wt 96.8 kg   LMP 12/26/2021 (Exact Date)   SpO2 100%   BMI 41.70 kg/m  EFM: 120/mod/+a/variable decels  CVE: Dilation: 6 Effacement (%): 80 Cervical Position: Anterior Station: -2 Presentation: Vertex Exam by:: Dr. Lucianne Muss   A&P: 22 y.o. G2P1001 [redacted]w[redacted]d here for IOL 2/2 A2GDM and cHTN.  #Labor: Cervix now 6cm, working towards adequate labor w pit titration #Pain: Epidural #FWB: Cat II strip, cont to monitor #GBS negative  #A2GDM: BG of 130 on last check, requiring SSI  low threshold to start endo tool  EFW 31`%, AC 36% at 32wk  #cHTN: BP ok  cont Procardia    Sundra Aland, MD 4:08 PM

## 2022-09-25 NOTE — Anesthesia Preprocedure Evaluation (Signed)
Anesthesia Evaluation  Patient identified by MRN, date of birth, ID band Patient awake    Reviewed: Allergy & Precautions, NPO status , Patient's Chart, lab work & pertinent test results  Airway Mallampati: II  TM Distance: >3 FB Neck ROM: Full    Dental  (+) Teeth Intact, Dental Advisory Given   Pulmonary asthma    Pulmonary exam normal breath sounds clear to auscultation       Cardiovascular hypertension, Pt. on medications Normal cardiovascular exam Rhythm:Regular Rate:Normal     Neuro/Psych negative neurological ROS     GI/Hepatic negative GI ROS, Neg liver ROS,,,  Endo/Other  diabetes, Gestational, Oral Hypoglycemic Agents  Morbid obesity  Renal/GU negative Renal ROS     Musculoskeletal negative musculoskeletal ROS (+)    Abdominal   Peds  Hematology  (+) Blood dyscrasia, anemia Plt 152k    Anesthesia Other Findings Day of surgery medications reviewed with the patient.  Reproductive/Obstetrics (+) Pregnancy                              Anesthesia Physical Anesthesia Plan  ASA: 3  Anesthesia Plan: Epidural   Post-op Pain Management:    Induction:   PONV Risk Score and Plan: 2 and Treatment may vary due to age or medical condition  Airway Management Planned: Natural Airway  Additional Equipment:   Intra-op Plan:   Post-operative Plan:   Informed Consent: I have reviewed the patients History and Physical, chart, labs and discussed the procedure including the risks, benefits and alternatives for the proposed anesthesia with the patient or authorized representative who has indicated his/her understanding and acceptance.     Dental advisory given  Plan Discussed with:   Anesthesia Plan Comments: (Patient identified. Risks/Benefits/Options discussed with patient including but not limited to bleeding, infection, nerve damage, paralysis, failed block, incomplete pain  control, headache, blood pressure changes, nausea, vomiting, reactions to medication both or allergic, itching and postpartum back pain. Confirmed with bedside nurse the patient's most recent platelet count. Confirmed with patient that they are not currently taking any anticoagulation, have any bleeding history or any family history of bleeding disorders. Patient expressed understanding and wished to proceed. All questions were answered. )         Anesthesia Quick Evaluation

## 2022-09-26 ENCOUNTER — Encounter (HOSPITAL_COMMUNITY): Payer: Self-pay | Admitting: Obstetrics and Gynecology

## 2022-09-26 LAB — CBC
HCT: 32.4 % — ABNORMAL LOW (ref 36.0–46.0)
Hemoglobin: 9.9 g/dL — ABNORMAL LOW (ref 12.0–15.0)
MCH: 20.9 pg — ABNORMAL LOW (ref 26.0–34.0)
MCHC: 30.6 g/dL (ref 30.0–36.0)
MCV: 68.5 fL — ABNORMAL LOW (ref 80.0–100.0)
Platelets: 151 10*3/uL (ref 150–400)
RBC: 4.73 MIL/uL (ref 3.87–5.11)
RDW: 21.1 % — ABNORMAL HIGH (ref 11.5–15.5)
WBC: 17.9 10*3/uL — ABNORMAL HIGH (ref 4.0–10.5)
nRBC: 0 % (ref 0.0–0.2)

## 2022-09-26 LAB — GLUCOSE, CAPILLARY: Glucose-Capillary: 179 mg/dL — ABNORMAL HIGH (ref 70–99)

## 2022-09-26 MED ORDER — SENNOSIDES-DOCUSATE SODIUM 8.6-50 MG PO TABS: 2.0000 | ORAL_TABLET | Freq: Every evening | ORAL | Status: DC | PRN

## 2022-09-26 MED ORDER — SODIUM CHLORIDE (PF) 0.9 % IJ SOLN
INTRAMUSCULAR | Status: AC
  Filled 2022-09-26: qty 10

## 2022-09-26 MED ORDER — ENOXAPARIN SODIUM 60 MG/0.6ML IJ SOSY
50.0000 mg | PREFILLED_SYRINGE | INTRAMUSCULAR | Status: DC
  Filled 2022-09-26: qty 0.6

## 2022-09-26 MED ORDER — DIBUCAINE (PERIANAL) 1 % EX OINT: 1.0000 | TOPICAL_OINTMENT | CUTANEOUS | Status: DC | PRN

## 2022-09-26 MED ORDER — NIFEDIPINE ER OSMOTIC RELEASE 30 MG PO TB24
60.0000 mg | ORAL_TABLET | Freq: Every day | ORAL | Status: DC
  Administered 2022-09-26 – 2022-09-27 (×2): 60 mg via ORAL
  Filled 2022-09-26 (×2): qty 2

## 2022-09-26 MED ORDER — COCONUT OIL OIL: 1.0000 | TOPICAL_OIL | Status: DC | PRN

## 2022-09-26 MED ORDER — SIMETHICONE 80 MG PO CHEW: 80.0000 mg | CHEWABLE_TABLET | ORAL | Status: DC | PRN

## 2022-09-26 MED ORDER — KETOROLAC TROMETHAMINE 30 MG/ML IJ SOLN
INTRAMUSCULAR | Status: AC
  Filled 2022-09-26: qty 1

## 2022-09-26 MED ORDER — IBUPROFEN 600 MG PO TABS
600.0000 mg | ORAL_TABLET | Freq: Four times a day (QID) | ORAL | Status: DC | PRN
  Administered 2022-09-26 – 2022-09-27 (×3): 600 mg via ORAL
  Filled 2022-09-26 (×3): qty 1

## 2022-09-26 MED ORDER — DEXMEDETOMIDINE HCL IN NACL 80 MCG/20ML IV SOLN
INTRAVENOUS | Status: AC
  Filled 2022-09-26: qty 20

## 2022-09-26 MED ORDER — EPHEDRINE 5 MG/ML INJ
INTRAVENOUS | Status: AC
  Filled 2022-09-26: qty 10

## 2022-09-26 MED ORDER — DEXAMETHASONE SODIUM PHOSPHATE 10 MG/ML IJ SOLN
INTRAMUSCULAR | Status: AC
  Filled 2022-09-26: qty 1

## 2022-09-26 MED ORDER — PRENATAL MULTIVITAMIN CH
1.0000 | ORAL_TABLET | Freq: Every day | ORAL | Status: DC
  Administered 2022-09-27: 1 via ORAL
  Filled 2022-09-26: qty 1

## 2022-09-26 MED ORDER — SODIUM BICARBONATE 8.4 % IV SOLN
INTRAVENOUS | Status: AC
  Filled 2022-09-26: qty 50

## 2022-09-26 MED ORDER — KETOROLAC TROMETHAMINE 30 MG/ML IJ SOLN: 30.0000 mg | Freq: Four times a day (QID) | INTRAMUSCULAR | Status: DC

## 2022-09-26 MED ORDER — ROCURONIUM BROMIDE 10 MG/ML (PF) SYRINGE
PREFILLED_SYRINGE | INTRAVENOUS | Status: AC
  Filled 2022-09-26: qty 10

## 2022-09-26 MED ORDER — SIMETHICONE 80 MG PO CHEW
80.0000 mg | CHEWABLE_TABLET | Freq: Three times a day (TID) | ORAL | Status: DC
  Administered 2022-09-26 – 2022-09-27 (×4): 80 mg via ORAL
  Filled 2022-09-26 (×5): qty 1

## 2022-09-26 MED ORDER — WITCH HAZEL-GLYCERIN EX PADS: 1.0000 | MEDICATED_PAD | CUTANEOUS | Status: DC | PRN

## 2022-09-26 MED ORDER — ACETAMINOPHEN 10 MG/ML IV SOLN
INTRAVENOUS | Status: AC
  Filled 2022-09-26: qty 100

## 2022-09-26 MED ORDER — OXYTOCIN-SODIUM CHLORIDE 30-0.9 UT/500ML-% IV SOLN
INTRAVENOUS | Status: AC
  Filled 2022-09-26: qty 1000

## 2022-09-26 MED ORDER — NIFEDIPINE ER OSMOTIC RELEASE 30 MG PO TB24
30.0000 mg | ORAL_TABLET | Freq: Every day | ORAL | Status: DC
  Filled 2022-09-26: qty 1

## 2022-09-26 MED ORDER — SUCCINYLCHOLINE CHLORIDE 200 MG/10ML IV SOSY
PREFILLED_SYRINGE | INTRAVENOUS | Status: AC
  Filled 2022-09-26: qty 10

## 2022-09-26 MED ORDER — OXYCODONE HCL 5 MG PO TABS: 5.0000 mg | ORAL_TABLET | ORAL | Status: DC | PRN

## 2022-09-26 MED ORDER — NIFEDIPINE ER OSMOTIC RELEASE 30 MG PO TB24: 60.0000 mg | ORAL_TABLET | Freq: Every day | ORAL | Status: DC

## 2022-09-26 MED ORDER — FUROSEMIDE 20 MG PO TABS
20.0000 mg | ORAL_TABLET | Freq: Every day | ORAL | Status: DC
  Administered 2022-09-26 – 2022-09-27 (×2): 20 mg via ORAL
  Filled 2022-09-26 (×2): qty 1

## 2022-09-26 MED ORDER — GABAPENTIN 100 MG PO CAPS
300.0000 mg | ORAL_CAPSULE | Freq: Two times a day (BID) | ORAL | Status: DC
  Administered 2022-09-26 – 2022-09-27 (×2): 300 mg via ORAL
  Filled 2022-09-26 (×3): qty 3

## 2022-09-26 MED ORDER — LIDOCAINE 2% (20 MG/ML) 5 ML SYRINGE
INTRAMUSCULAR | Status: AC
  Filled 2022-09-26: qty 10

## 2022-09-26 MED ORDER — MENTHOL 3 MG MT LOZG: 1.0000 | LOZENGE | OROMUCOSAL | Status: DC | PRN

## 2022-09-26 MED ORDER — PROPOFOL 10 MG/ML IV BOLUS
INTRAVENOUS | Status: AC
  Filled 2022-09-26: qty 20

## 2022-09-26 MED ORDER — OXYTOCIN-SODIUM CHLORIDE 30-0.9 UT/500ML-% IV SOLN
INTRAVENOUS | Status: AC
  Filled 2022-09-26: qty 500

## 2022-09-26 MED ORDER — ONDANSETRON HCL 4 MG/2ML IJ SOLN
4.0000 mg | Freq: Four times a day (QID) | INTRAMUSCULAR | Status: DC | PRN
  Administered 2022-09-26 (×2): 4 mg via INTRAVENOUS
  Filled 2022-09-26 (×2): qty 2

## 2022-09-26 MED ORDER — POLYETHYLENE GLYCOL 3350 17 G PO PACK
17.0000 g | PACK | Freq: Every day | ORAL | Status: DC
  Administered 2022-09-27: 17 g via ORAL
  Filled 2022-09-26: qty 1

## 2022-09-26 MED ORDER — IBUPROFEN 600 MG PO TABS
600.0000 mg | ORAL_TABLET | Freq: Four times a day (QID) | ORAL | Status: DC
  Filled 2022-09-26: qty 1

## 2022-09-26 MED ORDER — SCOPOLAMINE 1 MG/3DAYS TD PT72
MEDICATED_PATCH | TRANSDERMAL | Status: AC
  Filled 2022-09-26: qty 5

## 2022-09-26 MED ORDER — DEXAMETHASONE SODIUM PHOSPHATE 4 MG/ML IJ SOLN
INTRAMUSCULAR | Status: AC
  Filled 2022-09-26: qty 1

## 2022-09-26 MED ORDER — KETOROLAC TROMETHAMINE 30 MG/ML IJ SOLN
30.0000 mg | Freq: Four times a day (QID) | INTRAMUSCULAR | Status: AC
  Administered 2022-09-26 (×3): 30 mg via INTRAVENOUS
  Filled 2022-09-26 (×3): qty 1

## 2022-09-26 MED ORDER — OXYTOCIN-SODIUM CHLORIDE 30-0.9 UT/500ML-% IV SOLN
2.5000 [IU]/h | INTRAVENOUS | Status: AC
  Administered 2022-09-26: 2.5 [IU]/h via INTRAVENOUS
  Filled 2022-09-26: qty 500

## 2022-09-26 MED ORDER — ONDANSETRON HCL 4 MG/2ML IJ SOLN
INTRAMUSCULAR | Status: AC
  Filled 2022-09-26: qty 2

## 2022-09-26 MED ORDER — DIPHENHYDRAMINE HCL 25 MG PO CAPS: 25.0000 mg | ORAL_CAPSULE | Freq: Four times a day (QID) | ORAL | Status: DC | PRN

## 2022-09-26 NOTE — Discharge Summary (Signed)
Postpartum Discharge Summary  Date of Service updated***     Patient Name: Sabrina Dixon DOB: 02/11/2000 MRN: [id number]  Date of admission: 09/24/2022 Delivery date:09/25/2022 Delivering provider: Kyle Bing Date of discharge: 09/26/2022  Admitting diagnosis: Pregnancy at 38/6. IOL for GDMA2 and CHTN.  Secondary diagnosis:  History of cesarean section  Additional problems: BMI 40s. Recent chlamydia diagnosis.    Discharge diagnosis: {DX.:23714}                                              Post partum procedures:{Postpartum procedures:23558} Augmentation: AROM, Pitocin, and IP Foley Complications: arrest of dilation  Hospital course: Induction of Labor With Cesarean Section   22 y.o. yo G2P2002 at [redacted]w[redacted]d was admitted to the hospital 09/24/2022 for induction of labor.  She had episodes of category II tracing due to variables during her labor. The patient went for cesarean section due to Arrest of Dilation. Delivery details are as follows: Membrane Rupture Time/Date: 6:13 AM,09/25/2022  Delivery Method:C-Section, Low Transverse Operative Delivery:{Operative Delivery:30121} Details of operation can be found in separate operative Note.  Patient had a postpartum course complicated by***. She is ambulating, tolerating a regular diet, passing flatus, and urinating well.  Patient treated on HD#1 for chlamydia and partner to get tested and treated. Patient kept on procardia but at lower dose of 30 qday and started on qday five day lasix course. AM fasting CBGs were ***  Patient is discharged home in stable condition on 09/26/22.      Newborn Data: Birth date:09/25/2022 Birth time:10:38 PM Gender:Female Living status:Living Apgars:8 ,8  Weight:3147 g                               Magnesium Sulfate received: {Mag received:30440022} BMZ received: {BMZ received:30440023} Rhophylac:{Rhophylac received:30440032} WGN:{FAO:13086578} T-DaP:{Tdap:23962} Flu:  {ION:62952} Transfusion:{Transfusion received:30440034}  Physical exam  Vitals:   09/26/22 0030 09/26/22 0050 09/26/22 0150 09/26/22 0300  BP: (!) 147/83 131/80 129/82 130/81  Pulse: 88 83 (!) 59 70  Resp: 15 18 16 18   Temp:  98 F (36.7 C) 98.5 F (36.9 C) 98.4 F (36.9 C)  TempSrc:  Oral Oral Axillary  SpO2: 97%  98% 98%  Weight:      Height:       General: {Exam; general:21111117} Lochia: {Desc; appropriate/inappropriate:30686::"appropriate"} Uterine Fundus: {Desc; firm/soft:30687} Incision: {Exam; incision:21111123} DVT Evaluation: {Exam; WUX:3244010} Labs: Lab Results  Component Value Date   WBC 17.9 (H) 09/26/2022   HGB 9.9 (L) 09/26/2022   HCT 32.4 (L) 09/26/2022   MCV 68.5 (L) 09/26/2022   PLT 151 09/26/2022      Latest Ref Rng & Units 09/24/2022   12:04 PM  CMP  Glucose 70 - 99 mg/dL 272   BUN 6 - 20 mg/dL 7   Creatinine 5.36 - 6.44 mg/dL 0.34   Sodium 742 - 595 mmol/L 136   Potassium 3.5 - 5.1 mmol/L 3.6   Chloride 98 - 111 mmol/L 106   CO2 22 - 32 mmol/L 17   Calcium 8.9 - 10.3 mg/dL 8.4   Total Protein 6.5 - 8.1 g/dL 5.7   Total Bilirubin 0.3 - 1.2 mg/dL 0.6   Alkaline Phos 38 - 126 U/L 128   AST 15 - 41 U/L 13   ALT 0 - 44 U/L 9  Edinburgh Score:    04/27/2021   11:07 AM  Edinburgh Postnatal Depression Scale Screening Tool  I have been able to laugh and see the funny side of things. 0  I have looked forward with enjoyment to things. 0  I have blamed myself unnecessarily when things went wrong. 0  I have been anxious or worried for no good reason. 0  I have felt scared or panicky for no good reason. 0  Things have been getting on top of me. 0  I have been so unhappy that I have had difficulty sleeping. 0  I have felt sad or miserable. 0  I have been so unhappy that I have been crying. 0  The thought of harming myself has occurred to me. 0  Edinburgh Postnatal Depression Scale Total 0      After visit meds:  Allergies as of 09/26/2022    No Known Allergies   Med Rec must be completed prior to using this [facility]***        Discharge home in stable condition Infant Feeding: {Baby feeding:23562} Infant Disposition:{CHL IP OB HOME WITH ZOXWRU:04540} Discharge instruction: per After Visit Summary and Postpartum booklet. Activity: Advance as tolerated. Pelvic rest for 6 weeks.  Diet: {OB diet:21111121} Anticipated Birth Control: {Birth Control:23956} Postpartum Appointment:{Outpatient follow up:23559} Additional Postpartum F/U: {PP Procedure:23957} Future Appointments: Future Appointments  Date Time Provider Department Center  10/09/2022  9:00 AM CENTERING PROVIDER [facility]   Follow up Visit: [X]  message sent 8/27 for 1wk, bp RN visit     09/26/2022 [place] Bing, MD

## 2022-09-26 NOTE — Progress Notes (Signed)
Mom has been educated to feed baby due to babies  blood glucose level but mom has refused feeding at this time. RN offered to help baby latch but mom said she "can do it and did not need help". RN also offered formula, mom declines.

## 2022-09-26 NOTE — Lactation Note (Signed)
This note was copied from a baby's chart. Lactation Consultation Note  Patient Name: Sabrina Dixon [id number] Date: 09/26/2022 Age:21 hours Reason for consult: Follow-up assessment;Term;Maternal endocrine disorder  Visited P2 birth parent for follow-up consult. Birth parent states baby "Sabrina Dixon" is feeding well and that's "all he's been doing". Birth parent breastfed with her first baby for 3 months and is motivated to do the same with this baby. Birth parent states her first baby (daughter) did not latch on for the first week, but with baby "Sabrina Dixon" he latched on last night and has been feeding well at the breast ever since.   LC asked about Momcozy wearable pump on the side table. Birth parent mentioned she has been doing some pumping and that her colostrum is present in copious amounts in one breast and that her transitional milk is coming in on the other.   LC reviewed normal newborn behavior and hunger patterns.  LC reviewed pump cleaning and milk storage guidelines.  Feeding Plan: 1) Feed baby skin to skin every 2-3 hours, or sooner if baby cues. 2) Call RN for breastfeeding assistance.  Maternal Data Has patient been taught Hand Expression?: Yes Does the patient have breastfeeding experience prior to this delivery?: Yes How long did the patient breastfeed?: 3 months  Feeding Mother's Current Feeding Choice: Breast Milk  LATCH Score  No latch observed, baby had just fed recently and was swaddled/sleeping. Birth parent was eating lunch.  Interventions Interventions: Position options;Skin to skin;Breast feeding basics reviewed  Discharge Pump: Hands Free;Personal  Consult Status Consult Status: Follow-up Date: 09/27/22 Follow-up type: In-patient    Antionette Char 09/26/2022, 2:12 PM

## 2022-09-26 NOTE — Anesthesia Postprocedure Evaluation (Signed)
Anesthesia Post Note  Patient: Sabrina Dixon  Procedure(s) Performed: CESAREAN SECTION     Patient location during evaluation: PACU Anesthesia Type: Epidural Level of consciousness: awake Pain management: pain level controlled Vital Signs Assessment: post-procedure vital signs reviewed and stable Respiratory status: spontaneous breathing, nonlabored ventilation and respiratory function stable Cardiovascular status: stable Postop Assessment: no headache, no backache and epidural receding Anesthetic complications: no   No notable events documented.  Last Vitals:  Vitals:   09/26/22 0150 09/26/22 0300  BP: 129/82 130/81  Pulse: (!) 59 70  Resp: 16 18  Temp: 36.9 C 36.9 C  SpO2: 98% 98%    Last Pain:  Vitals:   09/26/22 0300  TempSrc: Axillary  PainSc: 0-No pain                 Linton Rump

## 2022-09-26 NOTE — Plan of Care (Signed)
Problem: Education: Goal: Knowledge of General Education information will improve Description: Including pain rating scale, medication(s)/side effects and non-pharmacologic comfort measures Outcome: Progressing   Problem: Health Behavior/Discharge Planning: Goal: Ability to manage health-related needs will improve Outcome: Progressing   Problem: Clinical Measurements: Goal: Ability to maintain clinical measurements within normal limits will improve Outcome: Progressing Goal: Will remain free from infection Outcome: Progressing Goal: Diagnostic test results will improve Outcome: Progressing Goal: Respiratory complications will improve Outcome: Progressing Goal: Cardiovascular complication will be avoided Outcome: Progressing   Problem: Activity: Goal: Risk for activity intolerance will decrease Outcome: Progressing   Problem: Nutrition: Goal: Adequate nutrition will be maintained Outcome: Progressing   Problem: Coping: Goal: Level of anxiety will decrease Outcome: Progressing   Problem: Elimination: Goal: Will not experience complications related to bowel motility Outcome: Progressing Goal: Will not experience complications related to urinary retention Outcome: Progressing   Problem: Pain Managment: Goal: General experience of comfort will improve Outcome: Progressing   Problem: Skin Integrity: Goal: Risk for impaired skin integrity will decrease Outcome: Progressing   Problem: Education: Goal: Knowledge of Childbirth will improve Outcome: Progressing Goal: Ability to make informed decisions regarding treatment and plan of care will improve Outcome: Progressing Goal: Ability to state and carry out methods to decrease the pain will improve Outcome: Progressing Goal: Individualized Educational Video(s) Outcome: Progressing   Problem: Coping: Goal: Ability to verbalize concerns and feelings about labor and delivery will improve Outcome: Progressing    Problem: Life Cycle: Goal: Ability to make normal progression through stages of labor will improve Outcome: Progressing Goal: Ability to effectively push during vaginal delivery will improve Outcome: Progressing   Problem: Role Relationship: Goal: Will demonstrate positive interactions with the child Outcome: Progressing   Problem: Pain Management: Goal: Relief or control of pain from uterine contractions will improve Outcome: Progressing   Problem: Education: Goal: Knowledge of disease or condition will improve Outcome: Progressing Goal: Knowledge of the prescribed therapeutic regimen will improve Outcome: Progressing   Problem: Fluid Volume: Goal: Peripheral tissue perfusion will improve Outcome: Progressing   Problem: Clinical Measurements: Goal: Complications related to disease process, condition or treatment will be avoided or minimized Outcome: Progressing   Problem: Education: Goal: Ability to describe self-care measures that may prevent or decrease complications (Diabetes Survival Skills Education) will improve Outcome: Progressing Goal: Individualized Educational Video(s) Outcome: Progressing   Problem: Coping: Goal: Ability to adjust to condition or change in health will improve Outcome: Progressing   Problem: Fluid Volume: Goal: Ability to maintain a balanced intake and output will improve Outcome: Progressing   Problem: Health Behavior/Discharge Planning: Goal: Ability to identify and utilize available resources and services will improve Outcome: Progressing Goal: Ability to manage health-related needs will improve Outcome: Progressing   Problem: Metabolic: Goal: Ability to maintain appropriate glucose levels will improve Outcome: Progressing   Problem: Nutritional: Goal: Maintenance of adequate nutrition will improve Outcome: Progressing Goal: Progress toward achieving an optimal weight will improve Outcome: Progressing   Problem: Skin  Integrity: Goal: Risk for impaired skin integrity will decrease Outcome: Progressing   Problem: Tissue Perfusion: Goal: Adequacy of tissue perfusion will improve Outcome: Progressing   Problem: Education: Goal: Knowledge of the prescribed therapeutic regimen will improve Outcome: Progressing Goal: Understanding of sexual limitations or changes related to disease process or condition will improve Outcome: Progressing Goal: Individualized Educational Video(s) Outcome: Progressing   Problem: Self-Concept: Goal: Communication of feelings regarding changes in body function or appearance will improve Outcome: Progressing   Problem: Skin Integrity:  Goal: Demonstration of wound healing without infection will improve Outcome: Progressing   Problem: Education: Goal: Knowledge of condition will improve Outcome: Progressing Goal: Individualized Educational Video(s) Outcome: Progressing Goal: Individualized Newborn Educational Video(s) Outcome: Progressing

## 2022-09-26 NOTE — Progress Notes (Signed)
Subjective: Postpartum Day 1: Cesarean Delivery Patient reports tolerating PO.    Objective: Vital signs in last 24 hours: Temp:  [97.4 F (36.3 C)-98.7 F (37.1 C)] 97.4 F (36.3 C) (08/27 1015) Pulse Rate:  [50-117] 50 (08/27 1015) Resp:  [11-21] 16 (08/27 1015) BP: (111-147)/(63-99) 138/87 (08/27 1015) SpO2:  [97 %-100 %] 99 % (08/27 1015)  Physical Exam:  General: alert, cooperative, and no distress Lochia: appropriate Uterine Fundus: firm Incision: healing well, no significant drainage DVT Evaluation: No evidence of DVT seen on physical exam. Negative Homan's sign. No cords or calf tenderness.  Recent Labs    09/25/22 0637 09/26/22 0017  HGB 10.3* 9.9*  HCT 33.7* 32.4*    Assessment/Plan: Status post Cesarean section. Doing well postoperatively.  Continue current care.  Levie Heritage, DO 09/26/2022, 11:30 AM

## 2022-09-26 NOTE — Social Work (Signed)
CSW received consult for hx of marijuana use.  Referral was screened out due to the following:  ~MOB had no documented substance use after initial prenatal visit/+UPT.  ~MOB had no positive drug screens after initial prenatal visit/+UPT.  ~Baby's UDS is negative.  Please consult CSW if current concerns arise or by MOB's request.  CSW will monitor CDS results and make report to Child Protective Services if warranted.  Kelsie Bowser, LCSWA Clinical Social Worker [phone]  

## 2022-09-26 NOTE — Discharge Instructions (Signed)
Continue to check your blood pressures twice a day Call the office for blood pressures that are consistently above 145 for the top number or 95 for the bottom number   Hypertension During Pregnancy Hypertension is also called high blood pressure. High blood pressure means that the force of your blood moving in your body is too strong. It can cause problems for you and your baby. Different types of high blood pressure can happen during pregnancy. The types are: High blood pressure before you got pregnant. This is called chronic hypertension.  This can continue during your pregnancy. Your doctor will want to keep checking your blood pressure. You may need medicine to keep your blood pressure under control while you are pregnant. You will need follow-up visits after you have your baby. High blood pressure that goes up during pregnancy when it was normal before. This is called gestational hypertension. It will usually get better after you have your baby, but your doctor will need to watch your blood pressure to make sure that it is getting better. Very high blood pressure during pregnancy. This is called preeclampsia. Very high blood pressure is an emergency that needs to be checked and treated right away. You may develop very high blood pressure after giving birth. This is called postpartum preeclampsia. This usually occurs within 48 hours after childbirth but may occur up to 6 weeks after giving birth. This is rare. How does this affect me? If you have high blood pressure during pregnancy, you have a higher chance of developing high blood pressure: As you get older. If you get pregnant again. In some cases, high blood pressure during pregnancy can cause: Stroke. Heart attack. Damage to the kidneys, lungs, or liver. Preeclampsia. Jerky movements you cannot control (convulsions or seizures). Problems with the placenta.  What can I do to lower my risk?  Keep a healthy weight. Eat a healthy  diet. Follow what your doctor tells you about treating any medical problems that you had before becoming pregnant. It is very important to go to all of your doctor visits. Your doctor will check your blood pressure and make sure that your pregnancy is progressing as it should. Treatment should start early if a problem is found.  Follow these instructions at home:  Take your blood pressure 1-2 times per day. Call the office if your blood pressure is 155 or higher for the top number or 105 or higher for the bottom number.    Eating and drinking  Drink enough fluid to keep your pee (urine) pale yellow. Avoid caffeine. Lifestyle Do not use any products that contain nicotine or tobacco, such as cigarettes, e-cigarettes, and chewing tobacco. If you need help quitting, ask your doctor. Do not use alcohol or drugs. Avoid stress. Rest and get plenty of sleep. Regular exercise can help. Ask your doctor what kinds of exercise are best for you. General instructions Take over-the-counter and prescription medicines only as told by your doctor. Keep all prenatal and follow-up visits as told by your doctor. This is important. Contact a doctor if: You have symptoms that your doctor told you to watch for, such as: Headaches. Nausea. Vomiting. Belly (abdominal) pain. Dizziness. Light-headedness. Get help right away if: You have: Very bad belly pain that does not get better with treatment. A very bad headache that does not get better. Vomiting that does not get better. Sudden, fast weight gain. Sudden swelling in your hands, ankles, or face. Blood in your pee. Blurry vision. Double vision.  Shortness of breath. Chest pain. Weakness on one side of your body. Trouble talking. Summary High blood pressure is also called hypertension. High blood pressure means that the force of your blood moving in your body is too strong. High blood pressure can cause problems for you and your baby. Keep all  follow-up visits as told by your doctor. This is important. This information is not intended to replace advice given to you by your health care provider. Make sure you discuss any questions you have with your health care provider. Document Released: 02/18/2010 Document Revised: 05/09/2018 Document Reviewed: 02/12/2018 Elsevier Patient Education  2020 [employer]  

## 2022-09-26 NOTE — Lactation Note (Signed)
This note was copied from a baby's chart. Lactation Consultation Note  Patient Name: Sabrina Dixon [id number] Date: 09/26/2022 Age:21 hours Reason for consult: Initial assessment;Term;Maternal endocrine disorder (See Birth Parent, MR- hx GDM2,CHTN and 2nd C/S delivery).  LC entered the room, infant was cuing to breastfeed, Birth Parent latched infant on her left breast using the football hold position, infant latched with depth, sustained latch and was still breastfeeding after 12 minutes when LC left the room. Birth Parent will continue to breastfeed infant by cues, on demand, every 2-3 hours, skin to skin. Birth Parent knows to call RN/LC for further latch assistance if needed. LC discussed infant's input and output and the importance of maternal rest, diet and hydration. Birth Parent was  made aware of O/P services, breastfeeding support groups, community resources, and our phone # for post-discharge questions.    Maternal Data Has patient been taught Hand Expression?: Yes Does the patient have breastfeeding experience prior to this delivery?: Yes How long did the patient breastfeed?: Per Birth Parent, she BF 1st child for 3 months who is currently [age] year of age.  Feeding Mother's Current Feeding Choice: Breast Milk  LATCH Score Latch: Grasps breast easily, tongue down, lips flanged, rhythmical sucking.  Audible Swallowing: Spontaneous and intermittent  Type of Nipple: Everted at rest and after stimulation  Comfort (Breast/Nipple): Soft / non-tender  Hold (Positioning): Assistance needed to correctly position infant at breast and maintain latch.  LATCH Score: 9   Lactation Tools Discussed/Used    Interventions    Discharge Pump: DEBP;Personal  Consult Status Consult Status: Follow-up    Frederico Hamman 09/26/2022, 1:12 AM

## 2022-09-27 ENCOUNTER — Other Ambulatory Visit (HOSPITAL_COMMUNITY): Payer: Self-pay

## 2022-09-27 LAB — SURGICAL PATHOLOGY

## 2022-09-27 MED ORDER — IBUPROFEN 600 MG PO TABS
600.0000 mg | ORAL_TABLET | Freq: Four times a day (QID) | ORAL | 0 refills | Status: AC | PRN
  Filled 2022-09-27: qty 30, 8d supply, fill #0

## 2022-09-27 MED ORDER — OXYCODONE HCL 5 MG PO TABS: 5.0000 mg | ORAL_TABLET | ORAL | 0 refills | Status: DC | PRN

## 2022-09-27 MED ORDER — NIFEDIPINE ER 60 MG PO TB24
60.0000 mg | ORAL_TABLET | Freq: Every day | ORAL | 0 refills | Status: DC
  Filled 2022-09-27: qty 30, 30d supply, fill #0

## 2022-09-27 MED ORDER — FUROSEMIDE 20 MG PO TABS
20.0000 mg | ORAL_TABLET | Freq: Every day | ORAL | 0 refills | Status: DC
  Filled 2022-09-27: qty 3, 3d supply, fill #0

## 2022-09-28 ENCOUNTER — Encounter: Payer: Self-pay | Admitting: Obstetrics and Gynecology

## 2022-10-04 ENCOUNTER — Ambulatory Visit: Payer: Medicaid Other | Admitting: *Deleted

## 2022-10-04 VITALS — BP 146/89 | HR 57 | Ht 60.0 in | Wt 196.3 lb

## 2022-10-04 DIAGNOSIS — Z4889 Encounter for other specified surgical aftercare: Secondary | ICD-10-CM

## 2022-10-04 DIAGNOSIS — Z0130 Encounter for examination of blood pressure without abnormal findings: Secondary | ICD-10-CM

## 2022-10-04 NOTE — Progress Notes (Signed)
Pt presents for incision check and BP check following C/S on 09/25/22. She is checking BP @ home twice daily with all normal values. She reports no H/A or visual disturbances.  BP - 146/89, P - 57.  Incision was assessed and steri-strips removed. Incision found to be well-healed with no redness, swelling or drainage noted. Dr. Vergie Living was consulted regarding pt status and advised to continue Nifidipine as prescribed. She should return to [facility] for BP's above 150's/90's.  Proper wound care instructions provided to pt. She voiced understanding of all information and instructions given.

## 2022-10-09 ENCOUNTER — Ambulatory Visit: Payer: Self-pay

## 2022-10-17 ENCOUNTER — Other Ambulatory Visit (INDEPENDENT_AMBULATORY_CARE_PROVIDER_SITE_OTHER): Payer: Self-pay | Admitting: Obstetrics and Gynecology

## 2022-10-19 ENCOUNTER — Encounter: Payer: Self-pay | Admitting: Obstetrics and Gynecology

## 2022-10-19 ENCOUNTER — Other Ambulatory Visit: Payer: Self-pay | Admitting: Advanced Practice Midwife

## 2022-10-19 MED ORDER — OXYCODONE HCL 5 MG PO TABS: 5.0000 mg | ORAL_TABLET | ORAL | 0 refills | Status: DC | PRN

## 2022-10-19 NOTE — Progress Notes (Signed)
Rx for oxycodone for postop pain renewed for 15 tablets. Pt to f/u if pain persists.

## 2022-10-30 ENCOUNTER — Other Ambulatory Visit: Payer: Self-pay | Admitting: Advanced Practice Midwife

## 2022-10-30 ENCOUNTER — Encounter: Payer: Self-pay | Admitting: Obstetrics and Gynecology

## 2022-10-31 ENCOUNTER — Other Ambulatory Visit: Payer: Self-pay | Admitting: Advanced Practice Midwife

## 2022-11-03 ENCOUNTER — Ambulatory Visit (INDEPENDENT_AMBULATORY_CARE_PROVIDER_SITE_OTHER): Payer: Medicaid Other | Admitting: Obstetrics and Gynecology

## 2022-11-03 ENCOUNTER — Other Ambulatory Visit: Payer: Self-pay

## 2022-11-03 DIAGNOSIS — I10 Essential (primary) hypertension: Secondary | ICD-10-CM | POA: Diagnosis not present

## 2022-11-03 MED ORDER — NIFEDIPINE ER 90 MG PO TB24: 90.0000 mg | ORAL_TABLET | Freq: Every day | ORAL | 2 refills | Status: AC

## 2022-11-03 NOTE — Progress Notes (Signed)
Post Partum Visit Note  Sabrina Dixon is a 22 y.o. [id number] female who presents for a postpartum visit. She is 5.4 weeks postpartum following a repeat cesarean section.  I have fully reviewed the prenatal and intrapartum course. The delivery was at 39 gestational weeks.  Anesthesia: epidural. Postpartum course has been good. Baby is doing well. Baby is feeding by breast. Bleeding  is moderate . Patient is now changing maxi pads two times an hour. Bowel function is normal. Bladder function is normal. Patient is not sexually active. Contraception method is none. Patient would like to wait. Postpartum depression screening: negative.   Upstream - 11/03/22 0907       Pregnancy Intention Screening   Does the patient want to become pregnant in the next year? No    Does the patient's partner want to become pregnant in the next year? No    Would the patient like to discuss contraceptive options today? No      Contraception Wrap Up   Current Method No Contraceptive Precautions    Contraception Counseling Provided Yes    How was the end contraceptive method provided? N/A            The pregnancy intention screening data noted above was reviewed. Potential methods of contraception were discussed. The patient elected to proceed with No data recorded.   Edinburgh Postnatal Depression Scale - 11/03/22 0906       Edinburgh Postnatal Depression Scale:  In the Past 7 Days   I have been able to laugh and see the funny side of things. 0    I have looked forward with enjoyment to things. 0    I have blamed myself unnecessarily when things went wrong. 0    I have been anxious or worried for no good reason. 0    I have felt scared or panicky for no good reason. 0    Things have been getting on top of me. 0    I have been so unhappy that I have had difficulty sleeping. 0    I have felt sad or miserable. 0    I have been so unhappy that I have been crying. 0    The thought of harming myself has  occurred to me. 0    Edinburgh Postnatal Depression Scale Total 0           15  Health Maintenance Due  Topic Date Due   Cervical Cancer Screening (Pap smear)  Never done    The following portions of the patient's history were reviewed and updated as appropriate: allergies, current medications, past family history, past medical history, past social history, past surgical history, and problem list.  Review of Systems Pertinent items are noted in HPI.  Objective:  BP (!) 152/90   Pulse (!) 108   Wt 186 lb 14.4 oz (84.8 kg)   LMP 12/26/2021 (Exact Date)   Breastfeeding Yes   BMI 36.50 kg/m    General:  alert, cooperative, and no distress   Breasts:  not indicated  Lungs: wheezes bilaterally  Heart:  regular rate and rhythm  Abdomen: soft, non-tender; bowel sounds normal; no masses,  no organomegaly   Wound well approximated incision, well healed, no drainage or induration  GU exam:  not indicated, pt declined pap today       Assessment:    normal postpartum exam.   Plan:   Essential components of care per ACOG recommendations:  1.  Mood and well  being: Patient with negative depression screening today. Reviewed local resources for support.  - Patient tobacco use? No.   - hx of drug use? No.    2. Infant care and feeding:  -Patient currently breastmilk feeding? Yes. Reviewed importance of draining breast regularly to support lactation.  -Social determinants of health (SDOH) reviewed in EPIC. No concerns.  3. Sexuality, contraception and birth spacing - Patient does not want a pregnancy in the next year.  Desired family size is 3 children.  - Reviewed reproductive life planning. Reviewed contraceptive methods based on pt preferences and effectiveness.  Patient desired Hormonal Implant today.   - Discussed birth spacing of 18 months  4. Sleep and fatigue -Encouraged family/partner/community support of 4 hrs of uninterrupted sleep to help with mood and fatigue  5.  Physical Recovery  - Discussed patients delivery and complications. She describes her labor as mixed. - Patient had a C-section repeat; no problems after deliver.  Arrest of dilation.  Perineal healing reviewed. Patient expressed understanding - Patient has urinary incontinence? No. - Patient is safe to resume physical and sexual activity  6.  Health Maintenance - HM due items addressed Yes - Last pap smear No results found for: "DIAGPAP" Pap smear not done at today's visit. Pt is due for first pap, but declined it today -Breast Cancer screening indicated? No.   7. Chronic Disease/Pregnancy Condition follow up: Hypertension and Gestational Diabetes BP still elevated, will need to restart procardia.  Recheck at time of postpartum 2 hour GTT  - PCP follow up Pt to follow up in 2-3 weeks for BP check, 2 hour GTT, pap smear and nexplanon placement  Warden Fillers, MD Center for [employer], [facility]

## 2022-11-22 ENCOUNTER — Other Ambulatory Visit: Payer: Self-pay | Admitting: Advanced Practice Midwife

## 2022-11-22 ENCOUNTER — Encounter: Payer: Self-pay | Admitting: Obstetrics and Gynecology

## 2022-11-22 ENCOUNTER — Other Ambulatory Visit: Payer: Self-pay

## 2022-11-22 DIAGNOSIS — O24415 Gestational diabetes mellitus in pregnancy, controlled by oral hypoglycemic drugs: Secondary | ICD-10-CM

## 2022-11-24 ENCOUNTER — Encounter: Payer: Self-pay | Admitting: Obstetrics and Gynecology

## 2022-11-24 ENCOUNTER — Other Ambulatory Visit: Payer: Self-pay

## 2022-11-24 ENCOUNTER — Other Ambulatory Visit: Payer: Medicaid Other

## 2022-11-24 ENCOUNTER — Ambulatory Visit (INDEPENDENT_AMBULATORY_CARE_PROVIDER_SITE_OTHER): Payer: Medicaid Other | Admitting: Obstetrics and Gynecology

## 2022-11-24 ENCOUNTER — Ambulatory Visit: Payer: Medicaid Other | Admitting: Obstetrics and Gynecology

## 2022-11-24 VITALS — BP 148/97 | HR 118 | Ht 61.5 in | Wt 183.9 lb

## 2022-11-24 DIAGNOSIS — Z8632 Personal history of gestational diabetes: Secondary | ICD-10-CM

## 2022-11-24 DIAGNOSIS — J4520 Mild intermittent asthma, uncomplicated: Secondary | ICD-10-CM | POA: Diagnosis not present

## 2022-11-24 DIAGNOSIS — Z3042 Encounter for surveillance of injectable contraceptive: Secondary | ICD-10-CM | POA: Diagnosis not present

## 2022-11-24 DIAGNOSIS — O24415 Gestational diabetes mellitus in pregnancy, controlled by oral hypoglycemic drugs: Secondary | ICD-10-CM

## 2022-11-24 MED ORDER — FLUTICASONE PROPIONATE HFA 110 MCG/ACT IN AERO
1.0000 | INHALATION_SPRAY | Freq: Two times a day (BID) | RESPIRATORY_TRACT | 12 refills | Status: AC
Start: 2022-11-24 — End: ?

## 2022-11-24 MED ORDER — MEDROXYPROGESTERONE ACETATE 150 MG/ML IM SUSP
150.0000 mg | Freq: Once | INTRAMUSCULAR | Status: AC
  Administered 2022-11-24: 150 mg via INTRAMUSCULAR

## 2022-11-24 NOTE — Progress Notes (Signed)
Caren Griffins here for Depo-Provera  Injection.  Injection administered without complication. Patient will return in 3 months Jan10/24-Jan24/24 for next injection.  Henrietta Dine, CMA 11/24/2022  9:44 AM

## 2022-11-24 NOTE — Progress Notes (Signed)
  CC: contraception, glucose test Subjective:    Patient ID: Sabrina Dixon, female    DOB: September 06, 2000, 22 y.o.   MRN: [id number]  HPI Pt returns for blood pressure, pap and contraception.  She was initially supposed to get a nexplanon, but was concerned regarding the local anesthetic.  She currently desires depo provera.  She understands she can convert to nexplanon at a later date.  Pt declined scheduled pap smear today due to her menses.   Review of Systems     Objective:   Physical Exam Vitals:   11/24/22 0854 11/24/22 0858  BP: (!) 140/100 (!) 148/97  Pulse: (!) 109 (!) 118         Assessment & Plan:   1. History of gestational diabetes mellitus (GDM) Postpartum 2 hour GTT today  2. Mild intermittent asthma, unspecified whether complicated Discussed pt with Dr. Crissie Reese regarding her asthma.  Flovent added to current rescue inhaler.  Pt will be referred to internal medicine due to need for continued care for asthma and elevated blood pressure - fluticasone (FLOVENT HFA) 110 MCG/ACT inhaler; Inhale 1 puff into the lungs 2 (two) times daily.  Dispense: 36 g; Refill: 12 - Ambulatory referral to Internal Medicine  3. Encounter for surveillance of injectable contraceptive  - medroxyPROGESTERone (DEPO-PROVERA) injection 150 mg  Postpartum blood pressure remains elevated.  She is advised to continue procardia until she is seen by internal medicine.  She will return in 2-3 weeks for pap smear.  Warden Fillers, MD Faculty Attending, Center for [facility]

## 2022-11-25 LAB — GLUCOSE TOLERANCE, 2 HOURS
Glucose, 2 hour: 132 mg/dL (ref 70–139)
Glucose, GTT - Fasting: 105 mg/dL — ABNORMAL HIGH (ref 70–99)

## 2022-12-29 ENCOUNTER — Emergency Department (HOSPITAL_COMMUNITY)
Admission: EM | Admit: 2022-12-29 | Discharge: 2022-12-29 | Disposition: A | Discharge status: Home / Self Care | Payer: Medicaid Other | Attending: Emergency Medicine | Admitting: Emergency Medicine

## 2022-12-29 ENCOUNTER — Other Ambulatory Visit: Payer: Self-pay

## 2022-12-29 ENCOUNTER — Emergency Department (HOSPITAL_COMMUNITY): Payer: Medicaid Other

## 2022-12-29 ENCOUNTER — Encounter: Payer: Self-pay | Admitting: Obstetrics and Gynecology

## 2022-12-29 ENCOUNTER — Encounter (HOSPITAL_COMMUNITY): Payer: Self-pay

## 2022-12-29 DIAGNOSIS — Z79899 Other long term (current) drug therapy: Secondary | ICD-10-CM | POA: Insufficient documentation

## 2022-12-29 DIAGNOSIS — J45901 Unspecified asthma with (acute) exacerbation: Secondary | ICD-10-CM | POA: Diagnosis not present

## 2022-12-29 DIAGNOSIS — I10 Essential (primary) hypertension: Secondary | ICD-10-CM | POA: Diagnosis not present

## 2022-12-29 DIAGNOSIS — R0602 Shortness of breath: Secondary | ICD-10-CM | POA: Diagnosis present

## 2022-12-29 MED ORDER — PREDNISONE 20 MG PO TABS: 40.0000 mg | ORAL_TABLET | Freq: Every day | ORAL | 0 refills | Status: AC

## 2022-12-29 MED ORDER — ALBUTEROL SULFATE HFA 108 (90 BASE) MCG/ACT IN AERS
2.0000 | INHALATION_SPRAY | RESPIRATORY_TRACT | Status: DC | PRN
Start: 2022-12-29 — End: 2022-12-29
  Administered 2022-12-29: 2 via RESPIRATORY_TRACT
  Filled 2022-12-29: qty 6.7

## 2022-12-29 NOTE — ED Notes (Signed)
Asked pt how they felt after using the albuterol inhaler. Pt states that she feels much better, no wheezing present, and SOB has resolved.

## 2022-12-29 NOTE — Discharge Instructions (Addendum)
You were seen today for asthma exacerbation.  You can take the albuterol inhaler at home 2 to 4 puffs every 2-4 hours.  You are also being placed on steroids.  While in steroids you should dump your breastmilk and use your reserve breastmilk.  You should follow-up with your regular doctor.  If you develop worsening difficulty breathing not responding to albuterol, chest pain or any other new concerning symptoms you should return to the ED.

## 2022-12-29 NOTE — ED Triage Notes (Addendum)
Pt presenting today complaining of SOB at rest and with excertion. Pt has HX of asthma, but unsure if this is an asthma attack. Pt audibly wheezing while talking. Pt has tried her inhalers without relief, pts albuterol inhaler currently out. Referred to the ER by her PCP.

## 2022-12-29 NOTE — ED Provider Notes (Signed)
[place] EMERGENCY DEPARTMENT AT [facility] Provider Note   CSN: [id number] Arrival date & time: 12/29/22  1346     History  Chief Complaint  Patient presents with   Shortness of Breath    Maryelizabeth Hedgecoth is a 22 y.o. female.   Shortness of Breath 22 year old female history of hypertension, asthma presenting for shortness of breath.  She is felt short of breath since last night has had wheezing.  No chest pain, no pleuritic pain or history of DVT or PE.  She has a 14-month-old at home and is currently breast-feeding.  No fevers or chills.  She got 2 puffs albuterol in triage and now states her breathing is back to baseline.  She has no concerns and would like to go home.  She has no pain at all.  No nausea or vomiting.  No belly pain.  No recent travel or immobilization.  No leg swelling.     Home Medications Prior to Admission medications   Medication Sig Start Date End Date Taking? Authorizing Provider  predniSONE (DELTASONE) 20 MG tablet Take 2 tablets (40 mg total) by mouth daily for 5 days. 12/29/22 01/03/23 Yes Laurence Spates, MD  fluticasone (FLOVENT HFA) 110 MCG/ACT inhaler Inhale 1 puff into the lungs 2 (two) times daily. 11/24/22   Warden Fillers, MD  ibuprofen (ADVIL) 600 MG tablet Take 1 tablet (600 mg total) by mouth every 6 (six) hours as needed for moderate pain. 09/27/22   Wouk, Wilfred Curtis, MD  NIFEdipine (ADALAT CC) 90 MG 24 hr tablet Take 1 tablet (90 mg total) by mouth daily. 11/03/22   Warden Fillers, MD  oxyCODONE (OXY IR/ROXICODONE) 5 MG immediate release tablet Take 1 tablet (5 mg total) by mouth every 4 (four) hours as needed for moderate pain. Patient not taking: Reported on 11/24/2022 10/19/22   Hurshel Party, CNM  prenatal vitamin w/FE, FA (NATACHEW) 29-1 MG CHEW chewable tablet Chew 1 tablet by mouth daily at 12 noon. 04/14/22   Marny Lowenstein, PA-C      Allergies    Patient has no known allergies.    Review of Systems    Review of Systems  Respiratory:  Positive for shortness of breath.   Review of systems completed and notable as per HPI.  ROS otherwise negative.   Physical Exam Updated Vital Signs BP (!) 157/98 (BP Location: Left Arm)   Pulse 70   Temp 98.3 F (36.8 C) (Oral)   Resp 18   Ht 5' (1.524 m)   Wt 81.6 kg   LMP 12/22/2022 (Approximate)   SpO2 99%   BMI 35.15 kg/m  Physical Exam Vitals and nursing note reviewed.  Constitutional:      General: She is not in acute distress.    Appearance: She is well-developed.  HENT:     Head: Normocephalic and atraumatic.     Nose: Nose normal.     Mouth/Throat:     Mouth: Mucous membranes are moist.     Pharynx: Oropharynx is clear.  Eyes:     Extraocular Movements: Extraocular movements intact.     Conjunctiva/sclera: Conjunctivae normal.     Pupils: Pupils are equal, round, and reactive to light.  Cardiovascular:     Rate and Rhythm: Normal rate and regular rhythm.     Pulses: Normal pulses.     Heart sounds: Normal heart sounds. No murmur heard. Pulmonary:     Effort: Pulmonary effort is normal. No respiratory distress.  Breath sounds: Wheezing present.     Comments: Very mild expiratory wheeze.  Good aeration.  Normal respiratory effort Abdominal:     Palpations: Abdomen is soft.     Tenderness: There is no abdominal tenderness.  Musculoskeletal:        General: No swelling.     Cervical back: Neck supple.     Right lower leg: No edema.     Left lower leg: No edema.  Skin:    General: Skin is warm and dry.     Capillary Refill: Capillary refill takes less than 2 seconds.  Neurological:     Mental Status: She is alert.  Psychiatric:        Mood and Affect: Mood normal.     ED Results / Procedures / Treatments   Labs (all labs ordered are listed, but only abnormal results are displayed) Labs Reviewed - No data to display  EKG None  Radiology DG Chest 2 View  Result Date: 12/29/2022 CLINICAL DATA:  Shortness of  breath EXAM: CHEST - 2 VIEW COMPARISON:  None Available. FINDINGS: The heart size and mediastinal contours are within normal limits. No consolidation, pneumothorax or effusion. No edema. The visualized skeletal structures are unremarkable. Air-fluid level along the stomach beneath the left hemidiaphragm. IMPRESSION: No acute cardiopulmonary disease. Electronically Signed   By: Karen Kays M.D.   On: 12/29/2022 15:15    Procedures Procedures    Medications Ordered in ED Medications  albuterol (VENTOLIN HFA) 108 (90 Base) MCG/ACT inhaler 2 puff (2 puffs Inhalation Given 12/29/22 1417)    ED Course/ Medical Decision Making/ A&P                                 Medical Decision Making Amount and/or Complexity of Data Reviewed Radiology: ordered.  Risk Prescription drug management.   Medical Decision Making:   Alexarae Leviton is a 22 y.o. female who presented to the ED today with asthma.  Vitals reviewed.  She has been short of breath for last day with wheezing.  She received 2 albuterol puffs in triage and now is feeling back to baseline.  Has very minimal expiratory wheeze with normal respiratory effort.  Offer additional breathing treatment she did not want.  Reviewed her chest x-ray, no signs of pneumonia or other acute abnormality.  Low sufficient for PE given clear wheezing with resolution with neb and no other DVT symptoms, no tachycardia or hypoxia.  No chest pain.  She is breast-feeding, discussed prednisone use.  She is comfortable this plan.  Return precautions given.  Already has albuterol inhaler.  Recommend PCP follow-up.   Patient placed on continuous vitals and telemetry monitoring while in ED which was reviewed periodically.  Reviewed and confirmed nursing documentation for past medical history, family history, social history.  Patient's presentation is most consistent with acute complicated illness / injury requiring diagnostic workup.           Final Clinical  Impression(s) / ED Diagnoses Final diagnoses:  Exacerbation of asthma, unspecified asthma severity, unspecified whether persistent    Rx / DC Orders ED Discharge Orders          Ordered    predniSONE (DELTASONE) 20 MG tablet  Daily        12/29/22 1543              Laurence Spates, MD 12/29/22 [phone]

## 2023-01-05 ENCOUNTER — Encounter: Payer: Self-pay | Admitting: Obstetrics and Gynecology

## 2023-01-05 ENCOUNTER — Other Ambulatory Visit: Payer: Self-pay

## 2023-01-05 ENCOUNTER — Other Ambulatory Visit (HOSPITAL_COMMUNITY)
Admission: RE | Admit: 2023-01-05 | Discharge: 2023-01-05 | Disposition: A | Discharge status: Home / Self Care | Payer: Medicaid Other | Source: Ambulatory Visit | Attending: Obstetrics and Gynecology | Admitting: Obstetrics and Gynecology

## 2023-01-05 ENCOUNTER — Ambulatory Visit: Payer: Medicaid Other | Admitting: Obstetrics and Gynecology

## 2023-01-05 VITALS — BP 126/79 | HR 98 | Wt 181.2 lb

## 2023-01-05 DIAGNOSIS — Z124 Encounter for screening for malignant neoplasm of cervix: Secondary | ICD-10-CM | POA: Insufficient documentation

## 2023-01-05 DIAGNOSIS — Z30430 Encounter for insertion of intrauterine contraceptive device: Secondary | ICD-10-CM

## 2023-01-05 DIAGNOSIS — Z3040 Encounter for surveillance of contraceptives, unspecified: Secondary | ICD-10-CM

## 2023-01-05 LAB — POCT PREGNANCY, URINE: Preg Test, Ur: NEGATIVE

## 2023-01-05 MED ORDER — LEVONORGESTREL 20.1 MCG/DAY IU IUD
1.0000 | INTRAUTERINE_SYSTEM | Freq: Once | INTRAUTERINE | Status: AC
  Administered 2023-01-05: 1 via INTRAUTERINE

## 2023-01-05 MED ORDER — IBUPROFEN 800 MG PO TABS
800.0000 mg | ORAL_TABLET | Freq: Once | ORAL | Status: AC
  Administered 2023-01-05: 800 mg via ORAL

## 2023-01-05 NOTE — Progress Notes (Signed)
  CC: pap and birth control Subjective:    Patient ID: Sabrina Dixon, female    DOB: 08/29/2000, 22 y.o.   MRN: [id number]  HPI  22 yo G2P2 seen for pap smear.  She wants some form of long term birth control.  She is currently on depo provera.  Discussed nexplanon and progesterone IUD.  Pt desires IUD  Review of Systems     Objective:   Physical Exam Vitals:   01/05/23 0947  BP: 126/79  Pulse: 98   SSE: pap taken without incident.    GYNECOLOGY OFFICE PROCEDURE NOTE  Sabrina Dixon is a 22 y.o. [id number] here for Liletta IUD insertion. No GYN concerns.  Last pap smear was on 01/05/23.  IUD Insertion Procedure Note Patient identified, informed consent performed, consent signed.   Discussed risks of irregular bleeding, cramping, infection, malpositioning or misplacement of the IUD outside the uterus which may require further procedure such as laparoscopy. Also discussed >99% contraception efficacy, increased risk of ectopic pregnancy with failure of method.  Time out was performed.  Urine pregnancy test negative.  Speculum placed in the vagina.  Cervix visualized.  Cleaned with Betadine x 2.  Grasped anteriorly with a single tooth tenaculum.  Uterus sounded to 7 cm. Liletta IUD placed per manufacturer's recommendations.  Strings trimmed to 3 cm. Tenaculum was removed, good hemostasis noted.  Patient tolerated procedure well.   Patient was given post-procedure instructions.  She was advised to have backup contraception for one week.  Patient was also asked to check IUD strings periodically and follow up in 4 weeks for IUD check.       Assessment & Plan:   1. Cervical cancer screening Pap results pending - Cytology - PAP( [place])  2. Encounter for surveillance of contraceptive device  - ibuprofen (ADVIL) tablet 800 mg - levonorgestrel (LILETTA) 20.1 MCG/DAY IUD 1 each  3. Encounter for IUD insertion F/u in 1 month for string check    Warden Fillers, MD Faculty  Attending, Center for [facility]

## 2023-01-08 LAB — CYTOLOGY - PAP: Diagnosis: NEGATIVE

## 2023-02-05 ENCOUNTER — Other Ambulatory Visit: Payer: Self-pay

## 2023-02-05 ENCOUNTER — Ambulatory Visit: Payer: Medicaid Other | Admitting: Obstetrics and Gynecology

## 2023-02-05 VITALS — BP 120/82 | HR 98 | Wt 177.0 lb

## 2023-02-05 DIAGNOSIS — Z30431 Encounter for routine checking of intrauterine contraceptive device: Secondary | ICD-10-CM | POA: Diagnosis not present

## 2023-02-05 DIAGNOSIS — Z1331 Encounter for screening for depression: Secondary | ICD-10-CM

## 2023-02-05 DIAGNOSIS — Z975 Presence of (intrauterine) contraceptive device: Secondary | ICD-10-CM

## 2023-02-05 NOTE — Progress Notes (Signed)
  CC: IUD check Subjective:    Patient ID: Sabrina Dixon, female    DOB: 2000-06-12, 23 y.o.   MRN: [id number]  HPI 23 yo G2P2 seen for IUD follow up.  Pt complains of irregular bleeding/spotting, but otherwise is doing well.   Review of Systems    Objective:   Physical Exam Vitals:   02/05/23 1113  BP: 120/82  Pulse: 98   SSE: cervix easily visualized IUD strings easily seen Light spotting noted      Assessment & Plan:   1. IUD (intrauterine device) in place (Primary) Pt advised that spotting is normal the first 3-6 months post placement and usually improves on its own.  Pt advised to let the office now if spotting is persistent after 3-4 months.    Jerilynn DELENA Buddle, MD Faculty Attending, Center for [facility]

## 2023-07-27 ENCOUNTER — Encounter: Payer: Self-pay | Admitting: Obstetrics and Gynecology

## 2023-07-27 NOTE — Telephone Encounter (Signed)
 Can we schedule this patient for IUD string check with Dr. Zina? She prefer on a Friday.   Thank you! Ermalinda GRADE CMA

## 2023-09-14 ENCOUNTER — Ambulatory Visit: Admitting: Family Medicine

## 2023-09-27 ENCOUNTER — Ambulatory Visit: Admitting: Obstetrics and Gynecology

## 2023-10-07 ENCOUNTER — Emergency Department (HOSPITAL_COMMUNITY)

## 2023-10-07 ENCOUNTER — Emergency Department (HOSPITAL_COMMUNITY)
Admission: EM | Admit: 2023-10-07 | Discharge: 2023-10-07 | Disposition: A | Discharge status: Home / Self Care | Attending: Emergency Medicine | Admitting: Emergency Medicine

## 2023-10-07 ENCOUNTER — Encounter (HOSPITAL_COMMUNITY): Payer: Self-pay | Admitting: Emergency Medicine

## 2023-10-07 ENCOUNTER — Other Ambulatory Visit: Payer: Self-pay

## 2023-10-07 DIAGNOSIS — Y9222 Religious institution as the place of occurrence of the external cause: Secondary | ICD-10-CM | POA: Insufficient documentation

## 2023-10-07 DIAGNOSIS — S80212A Abrasion, left knee, initial encounter: Secondary | ICD-10-CM | POA: Insufficient documentation

## 2023-10-07 DIAGNOSIS — W19XXXA Unspecified fall, initial encounter: Secondary | ICD-10-CM

## 2023-10-07 DIAGNOSIS — J45909 Unspecified asthma, uncomplicated: Secondary | ICD-10-CM | POA: Diagnosis not present

## 2023-10-07 DIAGNOSIS — M7918 Myalgia, other site: Secondary | ICD-10-CM

## 2023-10-07 DIAGNOSIS — I10 Essential (primary) hypertension: Secondary | ICD-10-CM | POA: Insufficient documentation

## 2023-10-07 DIAGNOSIS — M25562 Pain in left knee: Secondary | ICD-10-CM | POA: Diagnosis present

## 2023-10-07 DIAGNOSIS — W109XXA Fall (on) (from) unspecified stairs and steps, initial encounter: Secondary | ICD-10-CM | POA: Diagnosis not present

## 2023-10-07 DIAGNOSIS — S30811A Abrasion of abdominal wall, initial encounter: Secondary | ICD-10-CM | POA: Diagnosis not present

## 2023-10-07 DIAGNOSIS — Z7951 Long term (current) use of inhaled steroids: Secondary | ICD-10-CM | POA: Insufficient documentation

## 2023-10-07 LAB — PREGNANCY, URINE: Preg Test, Ur: NEGATIVE

## 2023-10-07 MED ORDER — LIDOCAINE 5 % EX PTCH
1.0000 | MEDICATED_PATCH | CUTANEOUS | Status: DC
  Administered 2023-10-07: 1 via TRANSDERMAL
  Filled 2023-10-07: qty 1

## 2023-10-07 MED ORDER — KETOROLAC TROMETHAMINE 15 MG/ML IJ SOLN
15.0000 mg | Freq: Once | INTRAMUSCULAR | Status: AC
  Administered 2023-10-07: 15 mg via INTRAMUSCULAR
  Filled 2023-10-07: qty 1

## 2023-10-07 NOTE — Discharge Instructions (Signed)
 Today you were seen after a fall.  Your workup in the emergency department was reassuring.  Your symptoms are likely due to musculoskeletal pain.  You may alternate Tylenol  and Motrin  as needed for pain.  Please return to the ED if you have sudden shortness of breath, uncontrollable vomiting, or worsening pain.  Please follow-up with your primary care if your symptoms persist for further evaluation workup.  Thank you for letting us  treat you today. After reviewing your labs and imaging, I feel you are safe to go home. Please follow up with your PCP in the next several days and provide them with your records from this visit. Return to the Emergency Room if pain becomes severe or symptoms worsen.

## 2023-10-07 NOTE — ED Triage Notes (Signed)
 [place] 10 steps at [place] while going [place] steps with children approx. [id number]. Complains of left side pain. Abrasion with tenderness on palpation to left flank.  Also abrasion to left knee and shin.  No LOC.

## 2023-10-07 NOTE — ED Provider Notes (Signed)
 [facility] Provider Note   CSN: [id number] Arrival date & time: 10/07/23  0941     Patient presents with: Sabrina Dixon is a 23 y.o. female past medical history significant for hypertension and asthma presents today after a mechanical fall down 10 steps at [employer] while going down with her children.  Patient reporting left side pain with associated abrasion.  Patient also has abrasion to left knee and shin.  Patient denies loss of consciousness, head injury, neck pain, loss of bowel or bladder control, saddle anesthesia, diplopia, tinnitus, or blood thinner use.    Fall       Prior to Admission medications   Medication Sig Start Date End Date Taking? Authorizing Provider  COMBIVENT RESPIMAT 20-100 MCG/ACT AERS respimat Inhale 1 puff into the lungs 4 (four) times daily. 12/30/22   [provider]  fluticasone  (FLOVENT  HFA) 110 MCG/ACT inhaler Inhale 1 puff into the lungs 2 (two) times daily. Patient not taking: Reported on 02/05/2023 11/24/22   Zina Jerilynn LABOR, MD  ibuprofen  (ADVIL ) 600 MG tablet Take 1 tablet (600 mg total) by mouth every 6 (six) hours as needed for moderate pain. Patient not taking: Reported on 02/05/2023 09/27/22   Kandis Devaughn Sayres, MD  NIFEdipine  (ADALAT  CC) 90 MG 24 hr tablet Take 1 tablet (90 mg total) by mouth daily. 11/03/22   Zina Jerilynn LABOR, MD  prenatal vitamin w/FE, FA (NATACHEW) 29-1 MG CHEW chewable tablet Chew 1 tablet by mouth daily at 12 noon. 04/14/22   Wenzel, Julie N, PA-C    Allergies: Patient has no known allergies.    Review of Systems  Musculoskeletal:  Positive for arthralgias.    Updated Vital Signs BP 139/83 (BP Location: Left Arm)   Pulse (!) 56   Temp 98.2 F (36.8 C)   Resp 18   LMP 10/01/2023 (Exact Date)   SpO2 96%   Physical Exam Vitals and nursing note reviewed.  Constitutional:      General: She is not in acute distress.    Appearance: Normal appearance. She is  well-developed.  HENT:     Head: Normocephalic and atraumatic.     Right Ear: External ear normal.     Left Ear: External ear normal.     Nose: Nose normal.  Eyes:     Conjunctiva/sclera: Conjunctivae normal.  Cardiovascular:     Rate and Rhythm: Normal rate and regular rhythm.     Pulses: Normal pulses.     Heart sounds: Normal heart sounds. No murmur heard. Pulmonary:     Effort: Pulmonary effort is normal. No respiratory distress.     Breath sounds: Normal breath sounds.  Abdominal:     Palpations: Abdomen is soft.     Tenderness: There is no abdominal tenderness.     Comments: Patient has tenderness to palpation of the left side/flank area with/abrasion  Musculoskeletal:        General: No swelling.     Cervical back: Neck supple.     Comments: Mild tenderness to palpation of the left patella with overlying abrasion on exam.  Patient is neurovascularly intact and was able to ambulate after fall  Skin:    General: Skin is warm and dry.     Capillary Refill: Capillary refill takes less than 2 seconds.  Neurological:     General: No focal deficit present.     Mental Status: She is alert and oriented to person, place, and time.  Sensory: No sensory deficit.  Psychiatric:        Mood and Affect: Mood normal.     (all labs ordered are listed, but only abnormal results are displayed) Labs Reviewed  PREGNANCY, URINE    EKG: None  Radiology: CT ABDOMEN PELVIS WO CONTRAST Result Date: 10/07/2023 CLINICAL DATA:  Status post fall with subsequent left-sided pain. EXAM: CT ABDOMEN AND PELVIS WITHOUT CONTRAST TECHNIQUE: Multidetector CT imaging of the abdomen and pelvis was performed following the standard protocol without IV contrast. RADIATION DOSE REDUCTION: This exam was performed according to the departmental dose-optimization program which includes automated exposure control, adjustment of the mA and/or kV according to patient size and/or use of iterative reconstruction  technique. COMPARISON:  None Available. FINDINGS: Lower chest: No acute abnormality. Hepatobiliary: No focal liver abnormality is seen. No gallstones, gallbladder wall thickening, or biliary dilatation. Pancreas: Unremarkable. No pancreatic ductal dilatation or surrounding inflammatory changes. Spleen: Normal in size without focal abnormality. Adrenals/Urinary Tract: Adrenal glands are unremarkable. Kidneys are normal, without renal calculi, focal lesion, or hydronephrosis. Bladder is unremarkable. Stomach/Bowel: Stomach is within normal limits. Appendix appears normal. No evidence of bowel wall thickening, distention, or inflammatory changes. Vascular/Lymphatic: No significant vascular findings are present. No enlarged abdominal or pelvic lymph nodes. Reproductive: An IUD is in place. The uterus and bilateral adnexa are otherwise unremarkable. Other: No abdominal wall hernia or abnormality. No abdominopelvic ascites. Musculoskeletal: No acute or significant osseous findings. IMPRESSION: No acute or active process within the abdomen or pelvis. Electronically Signed   By: Suzen Dials M.D.   On: 10/07/2023 12:34   DG Knee Complete 4 Views Left Result Date: 10/07/2023 CLINICAL DATA:  Status post fall. EXAM: LEFT KNEE - COMPLETE 4+ VIEW COMPARISON:  None Available. FINDINGS: No evidence of fracture, dislocation, or joint effusion. No evidence of arthropathy or other focal bone abnormality. Soft tissues are unremarkable. IMPRESSION: Negative. Electronically Signed   By: Suzen Dials M.D.   On: 10/07/2023 11:18     Procedures   Medications Ordered in the ED - No data to display                                  Medical Decision Making Amount and/or Complexity of Data Reviewed Labs: ordered. Radiology: ordered.   This patient presents to the ED for concern of mechanical fall differential diagnosis includes rib fracture, musculoskeletal pain, dislocation, splenic laceration, pneumothorax  Lab  Tests:  I Ordered, and personally interpreted labs.  The pertinent results include: Negative pregnancy   Imaging Studies ordered:  I ordered imaging studies including CT abdomen pelvis without contrast I independently visualized and interpreted imaging which showed no acute or active process within the abdomen or pelvis I agree with the radiologist interpretation Left knee x-ray which showed no acute findings   Medicines ordered and prescription drug management:  I ordered medication including Toradol     I have reviewed the patients home medicines and have made adjustments as needed   Problem List / ED Course:  Considered for admission or further workup however patient's vital signs, physical exam, labs, and imaging are reassuring.  Patient's symptoms likely due to musculoskeletal pain.  Patient given Toradol  while in ED.  Patient advised to take Tylenol  and Motrin  as needed for pain.  Patient given return precautions.  I feel patient is safe for discharge at this time.      Final diagnoses:  Fall, initial  encounter  Acute pain of left knee  Musculoskeletal pain    ED Discharge Orders     None          Sabrina Dixon 10/07/23 1247    Doretha Folks, MD 10/10/23 [phone]
